# Patient Record
Sex: Female | Born: 1975
Health system: Southern US, Community
[De-identification: ages and names within clinical notes are randomized; demographics above are authoritative.]

## PROBLEM LIST (undated history)

## (undated) DIAGNOSIS — N739 Female pelvic inflammatory disease, unspecified: Secondary | ICD-10-CM

## (undated) DIAGNOSIS — A64 Unspecified sexually transmitted disease: Secondary | ICD-10-CM

## (undated) HISTORY — DX: Unspecified sexually transmitted disease: A64

## (undated) HISTORY — PX: APPENDECTOMY: SHX54

## (undated) HISTORY — PX: TUBAL LIGATION: SHX77

## (undated) HISTORY — DX: Female pelvic inflammatory disease, unspecified: N73.9

## (undated) HISTORY — PX: VULVA /PERINEUM BIOPSY: SHX319

---

## 2008-08-13 ENCOUNTER — Emergency Department (HOSPITAL_COMMUNITY): Admission: EM | Admit: 2008-08-13 | Discharge: 2008-08-13 | Payer: Self-pay | Admitting: Emergency Medicine

## 2010-07-14 LAB — POCT URINALYSIS DIP (DEVICE)
Bilirubin Urine: NEGATIVE
Glucose, UA: NEGATIVE mg/dL
Hgb urine dipstick: NEGATIVE
Nitrite: NEGATIVE
Specific Gravity, Urine: 1.015 (ref 1.005–1.030)
pH: 7 (ref 5.0–8.0)

## 2014-05-14 ENCOUNTER — Emergency Department (HOSPITAL_COMMUNITY): Payer: BLUE CROSS/BLUE SHIELD

## 2014-05-14 ENCOUNTER — Encounter (HOSPITAL_COMMUNITY): Payer: Self-pay | Admitting: Emergency Medicine

## 2014-05-14 ENCOUNTER — Emergency Department (HOSPITAL_COMMUNITY)
Admission: EM | Admit: 2014-05-14 | Discharge: 2014-05-15 | Disposition: A | Discharge status: Home / Self Care | Payer: BLUE CROSS/BLUE SHIELD | Attending: Emergency Medicine | Admitting: Emergency Medicine

## 2014-05-14 ENCOUNTER — Emergency Department (INDEPENDENT_AMBULATORY_CARE_PROVIDER_SITE_OTHER)
Admission: EM | Admit: 2014-05-14 | Discharge: 2014-05-14 | Disposition: A | Discharge status: Nursing Home (Includes ICF) | Payer: BLUE CROSS/BLUE SHIELD | Source: Home / Self Care | Attending: Emergency Medicine | Admitting: Emergency Medicine

## 2014-05-14 DIAGNOSIS — R0602 Shortness of breath: Secondary | ICD-10-CM | POA: Diagnosis present

## 2014-05-14 DIAGNOSIS — R002 Palpitations: Secondary | ICD-10-CM | POA: Insufficient documentation

## 2014-05-14 DIAGNOSIS — R9431 Abnormal electrocardiogram [ECG] [EKG]: Secondary | ICD-10-CM | POA: Diagnosis not present

## 2014-05-14 DIAGNOSIS — R0789 Other chest pain: Secondary | ICD-10-CM | POA: Diagnosis not present

## 2014-05-14 DIAGNOSIS — Z720 Tobacco use: Secondary | ICD-10-CM | POA: Insufficient documentation

## 2014-05-14 DIAGNOSIS — F419 Anxiety disorder, unspecified: Secondary | ICD-10-CM | POA: Diagnosis not present

## 2014-05-14 DIAGNOSIS — R079 Chest pain, unspecified: Secondary | ICD-10-CM

## 2014-05-14 LAB — CBC
HCT: 39.8 % (ref 36.0–46.0)
HEMOGLOBIN: 13.3 g/dL (ref 12.0–15.0)
MCH: 25 pg — ABNORMAL LOW (ref 26.0–34.0)
MCHC: 33.4 g/dL (ref 30.0–36.0)
MCV: 74.7 fL — ABNORMAL LOW (ref 78.0–100.0)
PLATELETS: 233 10*3/uL (ref 150–400)
RBC: 5.33 MIL/uL — ABNORMAL HIGH (ref 3.87–5.11)
RDW: 16.7 % — AB (ref 11.5–15.5)
WBC: 10.8 10*3/uL — ABNORMAL HIGH (ref 4.0–10.5)

## 2014-05-14 LAB — I-STAT TROPONIN, ED: TROPONIN I, POC: 0 ng/mL (ref 0.00–0.08)

## 2014-05-14 LAB — BASIC METABOLIC PANEL
Anion gap: 7 (ref 5–15)
BUN: 6 mg/dL (ref 6–23)
CO2: 24 mmol/L (ref 19–32)
Calcium: 9.5 mg/dL (ref 8.4–10.5)
Chloride: 105 mmol/L (ref 96–112)
Creatinine, Ser: 0.67 mg/dL (ref 0.50–1.10)
GFR calc Af Amer: >90 mL/min (ref >90)
GFR calc non Af Amer: >90 mL/min (ref >90)
Glucose, Bld: 90 mg/dL (ref 70–99)
Potassium: 3.7 mmol/L (ref 3.5–5.1)
SODIUM: 136 mmol/L (ref 135–145)

## 2014-05-14 LAB — BRAIN NATRIURETIC PEPTIDE: B NATRIURETIC PEPTIDE 5: 13.5 pg/mL (ref 0.0–100.0)

## 2014-05-14 MED ORDER — ASPIRIN 81 MG PO CHEW
324.0000 mg | CHEWABLE_TABLET | Freq: Once | ORAL | Status: AC
  Administered 2014-05-14: 324 mg via ORAL

## 2014-05-14 MED ORDER — ASPIRIN 81 MG PO CHEW
CHEWABLE_TABLET | ORAL | Status: AC
  Filled 2014-05-14: qty 4

## 2014-05-14 NOTE — ED Notes (Addendum)
C/o sob and chest tightness/ does not describe any pain or discomfort.  Patient has been stressed recently.  Associates tightness with diaphoresis and sob. Also has tightness in left arm

## 2014-05-14 NOTE — ED Provider Notes (Signed)
CSN: [id number]     Arrival date & time 05/14/14  1852 History   First MD Initiated Contact with Patient 05/14/14 1908     No chief complaint on file.  (Consider location/radiation/quality/duration/timing/severity/associated sxs/prior Treatment) HPI  She is a 39 year old woman here for evaluation of chest tightness. She states that all day she has been having intermittent palpitations. She works the night shift and was trying to sleep today, but the palpitations kept her awake. When she got up, she had tightness in her left upper chest and shoulder, diaphoresis, shortness of breath. She states she did received some bad news yesterday. She denies any medical history. She has not taken any medications. She is a current smoker. She does state she has some mild tightness in her left anterior shoulder.  No past medical history on file. No past surgical history on file. No family history on file. History  Substance Use Topics  . Smoking status: Not on file  . Smokeless tobacco: Not on file  . Alcohol Use: Not on file   OB History    No data available     Review of Systems  Constitutional: Positive for diaphoresis. Negative for fever and chills.  Respiratory: Positive for shortness of breath.   Cardiovascular: Positive for chest pain and palpitations.    Allergies  Review of patient's allergies indicates not on file.  Home Medications   Prior to Admission medications   Not on File   BP 158/84 mmHg  Pulse 96  Temp(Src) 97.8 F (36.6 C) (Axillary)  Resp 18  SpO2 99% Physical Exam  Constitutional: She is oriented to person, place, and time. She appears well-developed and well-nourished. No distress.  Neck: Neck supple.  Cardiovascular: Normal rate, regular rhythm and normal heart sounds.   No murmur heard. Pulmonary/Chest: Effort normal and breath sounds normal. No respiratory distress. She has no wheezes. She has no rales.  Neurological: She is alert and oriented to person,  place, and time.    ED Course  EKG  Date/Time: 05/14/2014 7:40 PM Performed by: Melony Overly Authorized by: Melony Overly Interpreted by ED physician Previous ECG: no previous ECG available Rhythm: sinus rhythm Rate: normal BPM: 79 Conduction: conduction normal ST Segments: ST segments normal T flattening: III, aVF, V5 and V6 Clinical impression: non-specific ECG Comments: Normal sinus rhythm with nonspecific T-wave changes.   (including critical care time) Labs Review Labs Reviewed - No data to display  Imaging Review No results found.   MDM   1. Chest pain, unspecified chest pain type    Aspirin 324 mg given. Will transfer to [facility] ED via shuttle for chest pain rule out. Heart score is 3, assuming a negative troponin. TIMI score is 0. Her vital signs are stable, EKG is nonspecific. I suspect this is likely anxiety, however cannot rule out cardiac.    Melony Overly, MD 05/14/14 [phone]

## 2014-05-14 NOTE — Discharge Instructions (Signed)
You have an abnormal EKG and the emergency department tonight. As we discussed, I would advise you to follow-up with primary care and with cardiology.   Palpitations A palpitation is the feeling that your heartbeat is irregular or is faster than normal. It may feel like your heart is fluttering or skipping a beat. Palpitations are usually not a serious problem. However, in some cases, you may need further medical evaluation. CAUSES  Palpitations can be caused by:  Smoking.  Caffeine or other stimulants, such as diet pills or energy drinks.  Alcohol.  Stress and anxiety.  Strenuous physical activity.  Fatigue.  Certain medicines.  Heart disease, especially if you have a history of irregular heart rhythms (arrhythmias), such as atrial fibrillation, atrial flutter, or supraventricular tachycardia.  An improperly working pacemaker or defibrillator. DIAGNOSIS  To find the cause of your palpitations, your health care provider will take your medical history and perform a physical exam. Your health care provider may also have you take a test called an ambulatory electrocardiogram (ECG). An ECG records your heartbeat patterns over a 24-hour period. You may also have other tests, such as:  Transthoracic echocardiogram (TTE). During echocardiography, sound waves are used to evaluate how blood flows through your heart.  Transesophageal echocardiogram (TEE).  Cardiac monitoring. This allows your health care provider to monitor your heart rate and rhythm in real time.  Holter monitor. This is a portable device that records your heartbeat and can help diagnose heart arrhythmias. It allows your health care provider to track your heart activity for several days, if needed.  Stress tests by exercise or by giving medicine that makes the heart beat faster. TREATMENT  Treatment of palpitations depends on the cause of your symptoms and can vary greatly. Most cases of palpitations do not require any  treatment other than time, relaxation, and monitoring your symptoms. Other causes, such as atrial fibrillation, atrial flutter, or supraventricular tachycardia, usually require further treatment. HOME CARE INSTRUCTIONS   Avoid:  Caffeinated coffee, tea, soft drinks, diet pills, and energy drinks.  Chocolate.  Alcohol.  Stop smoking if you smoke.  Reduce your stress and anxiety. Things that can help you relax include:  A method of controlling things in your body, such as your heartbeats, with your mind (biofeedback).  Yoga.  Meditation.  Physical activity such as swimming, jogging, or walking.  Get plenty of rest and sleep. SEEK MEDICAL CARE IF:   You continue to have a fast or irregular heartbeat beyond 24 hours.  Your palpitations occur more often. SEEK IMMEDIATE MEDICAL CARE IF:  You have chest pain or shortness of breath.  You have a severe headache.  You feel dizzy or you faint. MAKE SURE YOU:  Understand these instructions.  Will watch your condition.  Will get help right away if you are not doing well or get worse. Document Released: 03/19/2000 Document Revised: 03/27/2013 Document Reviewed: 05/21/2011 [facility] Patient Information 2015 [place], [place]. This information is not intended to replace advice given to you by your health care provider. Make sure you discuss any questions you have with your health care provider. Stress Stress-related medical problems are becoming increasingly common. The body has a built-in physical response to stressful situations. Faced with pressure, challenge or danger, we need to react quickly. Our bodies release hormones such as cortisol and adrenaline to help do this. These hormones are part of the "fight or flight" response and affect the metabolic rate, heart rate and blood pressure, resulting in a heightened, stressed  state that prepares the body for optimum performance in dealing with a stressful situation. It is likely that  early man required these mechanisms to stay alive, but usually modern stresses do not call for this, and the same hormones released in today's world can damage health and reduce coping ability. CAUSES  Pressure to perform at work, at school or in sports.  Threats of physical violence.  Money worries.  Arguments.  Family conflicts.  Divorce or separation from significant other.  Bereavement.  New job or unemployment.  Changes in location.  Alcohol or drug abuse. SOMETIMES, THERE IS NO PARTICULAR REASON FOR DEVELOPING STRESS. Almost all people are at risk of being stressed at some time in their lives. It is important to know that some stress is temporary and some is long term.  Temporary stress will go away when a situation is resolved. Most people can cope with short periods of stress, and it can often be relieved by relaxing, taking a walk or getting any type of exercise, chatting through issues with friends, or having a good night's sleep.  Chronic (long-term, continuous) stress is much harder to deal with. It can be psychologically and emotionally damaging. It can be harmful both for an individual and for friends and family. SYMPTOMS Everyone reacts to stress differently. There are some common effects that help [place] recognize it. In times of extreme stress, people may:  Shake uncontrollably.  Breathe faster and deeper than normal (hyperventilate).  Vomit.  For people with asthma, stress can trigger an attack.  For some people, stress may trigger migraine headaches, ulcers, and body pain. PHYSICAL EFFECTS OF STRESS MAY INCLUDE:  Loss of energy.  Skin problems.  Aches and pains resulting from tense muscles, including neck ache, backache and tension headaches.  Increased pain from arthritis and other conditions.  Irregular heart beat (palpitations).  Periods of irritability or anger.  Apathy or depression.  Anxiety (feeling uptight or worrying).  Unusual  behavior.  Loss of appetite.  Comfort eating.  Lack of concentration.  Loss of, or decreased, sex-drive.  Increased smoking, drinking, or recreational drug use.  For women, missed periods.  Ulcers, joint pain, and muscle pain. Post-traumatic stress is the stress caused by any serious accident, strong emotional damage, or extremely difficult or violent experience such as rape or war. Post-traumatic stress victims can experience mixtures of emotions such as fear, shame, depression, guilt or anger. It may include recurrent memories or images that may be haunting. These feelings can last for weeks, months or even years after the traumatic event that triggered them. Specialized treatment, possibly with medicines and psychological therapies, is available. If stress is causing physical symptoms, severe distress or making it difficult for you to function as normal, it is worth seeing your caregiver. It is important to remember that although stress is a usual part of life, extreme or prolonged stress can lead to other illnesses that will need treatment. It is better to visit a doctor sooner rather than later. Stress has been linked to the development of high blood pressure and heart disease, as well as insomnia and depression. There is no diagnostic test for stress since everyone reacts to it differently. But a caregiver will be able to spot the physical symptoms, such as:  Headaches.  Shingles.  Ulcers. Emotional distress such as intense worry, low mood or irritability should be detected when the doctor asks pertinent questions to identify any underlying problems that might be the cause. In case there are physical  reasons for the symptoms, the doctor may also want to do some tests to exclude certain conditions. If you feel that you are suffering from stress, try to identify the aspects of your life that are causing it. Sometimes you may not be able to change or avoid them, but even a small change  can have a positive ripple effect. A simple lifestyle change can make all the difference. STRATEGIES THAT CAN HELP DEAL WITH STRESS:  Delegating or sharing responsibilities.  Avoiding confrontations.  Learning to be more assertive.  Regular exercise.  Avoid using alcohol or street drugs to cope.  Eating a healthy, balanced diet, rich in fruit and vegetables and proteins.  Finding humor or absurdity in stressful situations.  Never taking on more than you know you can handle comfortably.  Organizing your time better to get as much done as possible.  Talking to friends or family and sharing your thoughts and fears.  Listening to music or relaxation tapes.  Relaxation techniques like deep breathing, meditation, and yoga.  Tensing and then relaxing your muscles, starting at the toes and working up to the head and neck. If you think that you would benefit from help, either in identifying the things that are causing your stress or in learning techniques to help you relax, see a caregiver who is capable of helping you with this. Rather than relying on medications, it is usually better to try and identify the things in your life that are causing stress and try to deal with them. There are many techniques of managing stress including counseling, psychotherapy, aromatherapy, yoga, and exercise. Your caregiver can help you determine what is best for you. Document Released: 06/12/2002 Document Revised: 03/27/2013 Document Reviewed: 05/09/2007 [facility] Patient Information 2015 [place], [place]. This information is not intended to replace advice given to you by your health care provider. Make sure you discuss any questions you have with your health care provider.

## 2014-05-14 NOTE — ED Notes (Signed)
Pt. reports " dull" pain at left shoulder radiating down to left upper arm with palpitations , SOB and diaphoresis onset this evening .

## 2014-05-14 NOTE — ED Notes (Signed)
The pt reports that she feels better now.  No pain or  discomfiort

## 2014-05-14 NOTE — ED Provider Notes (Signed)
CSN: [id number]     Arrival date & time 05/14/14  2006 History   First MD Initiated Contact with Patient 05/14/14 2253     Chief Complaint  Patient presents with  . Shoulder Pain  . Arm Pain  . Shortness of Breath  . Palpitations   Tamara Banks is a 39 y.o. female who is a smoker with no known medical problems who presents to the ED from urgent care complaining of chest tightness, left shoulder pain and increased stress today. Patient reports that around 5 PM tonight she felt flushed and had heart racing and a dull feeling in her left arm. She denies any pain in her chest. She reports this lasted for approximately 1 hour. She presents resolved after receiving 324 aspirin at urgent care. Patient reports having increased stress since last night and received some bad news today. Patient attributes her symptoms to stress today. At the time of my evaluation the patient reports feeling better and has none of her previous symptoms. The patient reports a grandfather with a history of an MI but denies personal history cardiovascular disease. Patient is a smoker. Patient denies personal or family history of DVTs or PEs. Patient denies personal or family history of blood clotting disorders such as factor V Leiden, protein C or S deficiency. Patient is not on any exogenous estrogens. Patient denies fevers, chills, recent illness, leg swelling, chest pain, wheezing, cough, rashes, leg pain, numbness, tingling, abdominal pain, nausea, vomiting or headache. She denies suicidal or homicidal ideations.  (Consider location/radiation/quality/duration/timing/severity/associated sxs/prior Treatment) HPI  History reviewed. No pertinent past medical history. Past Surgical History  Procedure Laterality Date  . Appendectomy     No family history on file. History  Substance Use Topics  . Smoking status: Current Every Day Smoker  . Smokeless tobacco: Not on file  . Alcohol Use: Yes   OB History    No data available      Review of Systems  Constitutional: Negative for fever and chills.  HENT: Negative for congestion, ear pain and sore throat.   Eyes: Negative for visual disturbance.  Respiratory: Positive for chest tightness and shortness of breath. Negative for cough and wheezing.   Cardiovascular: Positive for palpitations. Negative for chest pain and leg swelling.  Gastrointestinal: Negative for nausea, vomiting, abdominal pain and diarrhea.  Genitourinary: Negative for dysuria.  Musculoskeletal: Negative for back pain and neck pain.  Skin: Negative for rash.  Neurological: Negative for weakness, light-headedness, numbness and headaches.  Psychiatric/Behavioral: Negative for suicidal ideas. The patient is nervous/anxious.       Allergies  Review of patient's allergies indicates no known allergies.  Home Medications   Prior to Admission medications   Not on File   BP 121/75 mmHg  Pulse 72  Temp(Src) 98.4 F (36.9 C) (Oral)  Resp 18  Wt 163 lb 3 oz (74.021 kg)  SpO2 100%  LMP 05/02/2014 Physical Exam  Constitutional: She appears well-developed and well-nourished. No distress.  Pleasant well-appearing female. Nontoxic appearing.  HENT:  Head: Normocephalic and atraumatic.  Mouth/Throat: Oropharynx is clear and moist. No oropharyngeal exudate.  Eyes: Conjunctivae are normal. Pupils are equal, round, and reactive to light. Right eye exhibits no discharge. Left eye exhibits no discharge.  Neck: Neck supple. No JVD present. No tracheal deviation present.  Cardiovascular: Normal rate, regular rhythm, normal heart sounds and intact distal pulses.  Exam reveals no gallop and no friction rub.   No murmur heard. Bilateral radial pulses are intact. Bilateral posterior  tibialis and dorsalis pedis pulses are intact.  Pulmonary/Chest: Effort normal and breath sounds normal. No respiratory distress. She has no wheezes. She has no rales.  Abdominal: Soft. Bowel sounds are normal. She exhibits no  distension. There is no tenderness.  Musculoskeletal: She exhibits no edema or tenderness.  No lower extremity edema or tenderness.  Lymphadenopathy:    She has no cervical adenopathy.  Neurological: She is alert. Coordination normal.  Skin: Skin is warm and dry. No rash noted. She is not diaphoretic. No erythema. No pallor.  Psychiatric: She has a normal mood and affect. Her behavior is normal. Her mood appears not anxious. She does not exhibit a depressed mood. She expresses no homicidal and no suicidal ideation.  Nursing note and vitals reviewed.   ED Course  Procedures (including critical care time) Labs Review Labs Reviewed  CBC - Abnormal; Notable for the following:    WBC 10.8 (*)    RBC 5.33 (*)    MCV 74.7 (*)    MCH 25.0 (*)    RDW 16.7 (*)    All other components within normal limits  BASIC METABOLIC PANEL  BRAIN NATRIURETIC PEPTIDE  I-STAT TROPOININ, ED    Imaging Review Dg Chest 2 View  05/14/2014   CLINICAL DATA:  Chest pain for 2 days  EXAM: CHEST - 2 VIEW  COMPARISON:  None available  FINDINGS: Lungs are clear. Heart size and mediastinal contours are within normal limits. No effusion.  No pneumothorax. Visualized skeletal structures are unremarkable.  IMPRESSION: No acute cardiopulmonary disease.   Electronically Signed   By: Lucrezia Europe M.D.   On: 05/14/2014 20:49     EKG Interpretation   Date/Time:  Tuesday May 14 2014 20:11:14 EST Ventricular Rate:  84 PR Interval:  136 QRS Duration: 80 QT Interval:  364 QTC Calculation: 430 R Axis:   59 Text Interpretation:  Normal sinus rhythm with sinus arrhythmia T wave  abnormality, consider inferior ischemia Abnormal ECG Since last tracing T  wave abnormality is more pronounced Confirmed by Eulis Foster  MD, ELLIOTT  ([id number]) on 05/14/2014 11:37:09 PM      MDM   Final diagnoses:  Abnormal EKG  Intermittent palpitations    Patient presented with symptoms of chest tightness, palpitations and dull feeling in the  left arm. At time of evaluation the symptoms have resolved. Patient is to be discharged with recommendation to follow up with PCP in regards to today's hospital visit. Chest discomfort is not likely of cardiac or pulmonary etiology d/t presentation, perc negative, VSS, no tracheal deviation, no JVD or new murmur, RRR, breath sounds equal bilaterally, EKG without acute abnormalities, negative troponin, and negative CXR. Pt has been advised to return to the ED if CP becomes exertional, associated with diaphoresis or nausea, radiates to left jaw/arm, worsens or becomes concerning in any way. Pt appears reliable for follow up and is agreeable to discharge. Patient does have abnormal EKG with T-wave inversion. I advised patient she should follow-up with primary care and cardiology for stress test and echocardiogram. The patient verbalized understanding and agreement with plan.  Case has been discussed with and seen by Dr. Eulis Foster who agrees with the above plan to discharge.       Hanley Hays, PA-C 05/14/14 [place], MD 05/15/14 [phone]

## 2016-12-02 ENCOUNTER — Encounter (HOSPITAL_COMMUNITY): Payer: Self-pay | Admitting: Emergency Medicine

## 2016-12-02 DIAGNOSIS — Y929 Unspecified place or not applicable: Secondary | ICD-10-CM | POA: Insufficient documentation

## 2016-12-02 DIAGNOSIS — X58XXXA Exposure to other specified factors, initial encounter: Secondary | ICD-10-CM | POA: Insufficient documentation

## 2016-12-02 DIAGNOSIS — Y939 Activity, unspecified: Secondary | ICD-10-CM | POA: Insufficient documentation

## 2016-12-02 DIAGNOSIS — F17200 Nicotine dependence, unspecified, uncomplicated: Secondary | ICD-10-CM | POA: Insufficient documentation

## 2016-12-02 DIAGNOSIS — Y999 Unspecified external cause status: Secondary | ICD-10-CM | POA: Insufficient documentation

## 2016-12-02 DIAGNOSIS — S161XXA Strain of muscle, fascia and tendon at neck level, initial encounter: Secondary | ICD-10-CM | POA: Insufficient documentation

## 2016-12-02 NOTE — ED Triage Notes (Signed)
Pt states about an hour ago she started having pain in her left neck and shoulder area  Pt states the pain then goes across her shoulder area and down into her mid back  Pt states her blood pressure is elevated  Pt states the pain is intermittent and intense when it comes

## 2016-12-03 ENCOUNTER — Emergency Department (HOSPITAL_COMMUNITY)
Admission: EM | Admit: 2016-12-03 | Discharge: 2016-12-03 | Disposition: A | Discharge status: Home / Self Care | Payer: BLUE CROSS/BLUE SHIELD | Attending: Emergency Medicine | Admitting: Emergency Medicine

## 2016-12-03 DIAGNOSIS — S161XXA Strain of muscle, fascia and tendon at neck level, initial encounter: Secondary | ICD-10-CM

## 2016-12-03 LAB — BASIC METABOLIC PANEL
Anion gap: 9 (ref 5–15)
BUN: 14 mg/dL (ref 6–20)
CO2: 24 mmol/L (ref 22–32)
CREATININE: 0.58 mg/dL (ref 0.44–1.00)
Calcium: 9.5 mg/dL (ref 8.9–10.3)
Chloride: 108 mmol/L (ref 101–111)
GFR calc Af Amer: >60 mL/min (ref >60)
GLUCOSE: 97 mg/dL (ref 65–99)
POTASSIUM: 3.5 mmol/L (ref 3.5–5.1)
Sodium: 141 mmol/L (ref 135–145)

## 2016-12-03 LAB — CBC
HEMATOCRIT: 35.4 % — AB (ref 36.0–46.0)
Hemoglobin: 12.2 g/dL (ref 12.0–15.0)
MCH: 24.5 pg — ABNORMAL LOW (ref 26.0–34.0)
MCHC: 34.5 g/dL (ref 30.0–36.0)
MCV: 71.2 fL — ABNORMAL LOW (ref 78.0–100.0)
PLATELETS: 206 10*3/uL (ref 150–400)
RBC: 4.97 MIL/uL (ref 3.87–5.11)
RDW: 16.7 % — AB (ref 11.5–15.5)
WBC: 10 10*3/uL (ref 4.0–10.5)

## 2016-12-03 LAB — I-STAT TROPONIN, ED: Troponin i, poc: 0 ng/mL (ref 0.00–0.08)

## 2016-12-03 MED ORDER — IBUPROFEN 600 MG PO TABS: 600.0000 mg | ORAL_TABLET | Freq: Four times a day (QID) | ORAL | 0 refills | Status: AC | PRN

## 2016-12-03 MED ORDER — IBUPROFEN 800 MG PO TABS
800.0000 mg | ORAL_TABLET | Freq: Once | ORAL | Status: AC
  Administered 2016-12-03: 800 mg via ORAL
  Filled 2016-12-03: qty 1

## 2016-12-03 MED ORDER — CYCLOBENZAPRINE HCL 10 MG PO TABS: 10.0000 mg | ORAL_TABLET | Freq: Two times a day (BID) | ORAL | 0 refills | Status: DC | PRN

## 2016-12-03 NOTE — ED Provider Notes (Signed)
[facility] DEPT Provider Note   CSN: [id number] Arrival date & time: 12/02/16  2328     History   Chief Complaint Chief Complaint  Patient presents with  . Back Pain    HPI Tamara Banks is a 41 y.o. female.  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 She described pain as a sharp pain tightness sensation, initially intense. She did endorse some mild dizziness when the pain first brought on. She went to work, and discussed this with her coworker. She had a blood pressure check at that time but was told that it was high and patient came to ER for further evaluation. While waiting the ED, her symptoms improved. Currently her pain is 3 of 10. She did take aspirin and Tums prior to arrival which seems to help. She is a smoker and smoked half a pack a day, she is a social drinker. Does report family history of cardiac disease without any history of premature cardiac death. 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 No other strenuous activities. Currently patient denies any fever, chills, headache, nausea, vomiting, diarrhea, diaphoresis, arm pain, arm weakness, active chest pain or trouble breathing. No other complaint. No history of high blood pressure.   History reviewed. No pertinent past medical history.  There are no active problems to display for this patient.   Past Surgical History:  Procedure Laterality Date  . APPENDECTOMY    . TUBAL LIGATION      OB History    No data available       Home Medications    Prior to Admission medications   Medication Sig Start Date End Date Taking? Authorizing Provider  aspirin 325 MG tablet Take 325 mg by mouth every 6 (six) hours as needed for moderate pain.   Yes [provider]  calcium carbonate (TUMS -  DOSED IN MG ELEMENTAL CALCIUM) 500 MG chewable tablet Chew 3 tablets by mouth every 4 (four) hours as needed for indigestion or heartburn.   Yes [provider]    Family History Family History  Problem Relation Age of Onset  . Hypertension Father     Social History Social History  Substance Use Topics  . Smoking status: Current Every Day Smoker  . Smokeless tobacco: Never Used  . Alcohol use Yes     Allergies   Patient has no known allergies.   Review of Systems Review of Systems  All other systems reviewed and are negative.    Physical Exam Updated Vital Signs BP (!) 154/96 (BP Location: Right Arm)   Pulse 87   Temp 98.1 F (36.7 C) (Oral)   Resp 20   LMP 11/29/2016 (Exact Date)   SpO2 100%   Physical Exam  Constitutional: She is oriented to person, place, and time. She appears well-developed and well-nourished. No distress.  HENT:  Head: Normocephalic and atraumatic.  Eyes: Pupils are equal, round, and reactive to light. Conjunctivae and EOM are normal.  Neck: Normal range of motion. Neck supple.  No nuchal rigidity  Cardiovascular: Normal rate, regular rhythm and intact distal pulses.   Pulmonary/Chest: Effort normal and breath sounds normal.  Abdominal: Soft. Bowel sounds are normal.  Musculoskeletal: She exhibits tenderness (mild tenderness noted to left Cervical spinal muscle on palpation, left  trapezius, and posterior left scapularegion).  Neurological: She is alert and oriented to person, place, and time.  Skin: No rash noted.  Psychiatric: She has a normal mood and affect.  Nursing note and vitals reviewed.    ED Treatments / Results  Labs (all labs ordered are listed, but only abnormal results are displayed) Labs Reviewed  CBC - Abnormal; Notable for the following:       Result Value   HCT 35.4 (*)    MCV 71.2 (*)    MCH 24.5 (*)    RDW 16.7 (*)    All other components within normal limits  BASIC METABOLIC PANEL  I-STAT  TROPONIN, ED    EKG  EKG Interpretation  Date/Time:  Thursday December 02 2016 23:34:44 EDT Ventricular Rate:  86 PR Interval:    QRS Duration: 103 QT Interval:  347 QTC Calculation: 415 R Axis:   55 Text Interpretation:  Sinus rhythm Borderline T abnormalities, inferior leads No significant change since last tracing Confirmed by Glynn Octave [phone]) on 12/03/2016 2:16:33 AM       Radiology No results found.  Procedures Procedures (including critical care time)  Medications Ordered in ED Medications - No data to display   Initial Impression / Assessment and Plan / ED Course  I have reviewed the triage vital signs and the nursing notes.  Pertinent labs & imaging results that were available during my care of the patient were reviewed by me and considered in my medical decision making (see chart for details).     BP 122/86 (BP Location: Right Arm)   Pulse 77   Temp 98.1 F (36.7 C) (Oral)   Resp 16   LMP 11/29/2016 (Exact Date)   SpO2 100%    Final Clinical Impressions(s) / ED Diagnoses   Final diagnoses:  Acute strain of neck muscle, initial encounter    New Prescriptions New Prescriptions   CYCLOBENZAPRINE (FLEXERIL) 10 MG TABLET    Take 1 tablet (10 mg total) by mouth 2 (two) times daily as needed for muscle spasms.   IBUPROFEN (ADVIL,MOTRIN) 600 MG TABLET    Take 1 tablet (600 mg total) by mouth every 6 (six) hours as needed.   2:48 AM Patient here with left neck pain reproducible on exam.no carotid bruit noted. No signs of infectious etiology. Patient is neurovascularly intact. No active chest pain or short of breath.she is well-appearing, suspect pain is likely musculoskeletal.the EKG was without concerning features. Normal CBC, normal BMP. patient given reassurance, ibuprofen as needed for pain. Encouraged patient to follow-up outpatient for further evaluation of her condition. Her blood pressure is mildly elevated at 154/96, and this will need to be  rechecked by her PCP.  Return precaution discussed.  3:09 AM BP normalized on recheck.   Pt stasble for discharge.    Domenic Moras, PA-C 12/03/16 0310    Everlene Balls, MD 12/03/16 [phone]

## 2017-01-05 ENCOUNTER — Other Ambulatory Visit (HOSPITAL_COMMUNITY)
Admission: RE | Admit: 2017-01-05 | Discharge: 2017-01-05 | Disposition: A | Discharge status: Home / Self Care | Payer: BLUE CROSS/BLUE SHIELD | Source: Ambulatory Visit | Attending: Certified Nurse Midwife | Admitting: Certified Nurse Midwife

## 2017-01-05 ENCOUNTER — Ambulatory Visit (INDEPENDENT_AMBULATORY_CARE_PROVIDER_SITE_OTHER): Payer: BLUE CROSS/BLUE SHIELD | Admitting: Certified Nurse Midwife

## 2017-01-05 ENCOUNTER — Encounter: Payer: Self-pay | Admitting: Certified Nurse Midwife

## 2017-01-05 VITALS — BP 118/80 | HR 72 | Resp 16 | Ht 64.75 in | Wt 177.0 lb

## 2017-01-05 DIAGNOSIS — Z0000 Encounter for general adult medical examination without abnormal findings: Secondary | ICD-10-CM | POA: Diagnosis not present

## 2017-01-05 DIAGNOSIS — N6320 Unspecified lump in the left breast, unspecified quadrant: Secondary | ICD-10-CM

## 2017-01-05 DIAGNOSIS — N907 Vulvar cyst: Secondary | ICD-10-CM | POA: Diagnosis not present

## 2017-01-05 DIAGNOSIS — Z01419 Encounter for gynecological examination (general) (routine) without abnormal findings: Secondary | ICD-10-CM | POA: Diagnosis not present

## 2017-01-05 DIAGNOSIS — Z124 Encounter for screening for malignant neoplasm of cervix: Secondary | ICD-10-CM

## 2017-01-05 NOTE — Progress Notes (Signed)
Scheduled patient while in office for bilateral diagnostic mammogram [facility] 01/11[facility]2017 at 10:40 am. Patient is agreeable to date and time. Placed in mammogram hold. Scheduled to see Dr.Jertson for right vulva sebaceous cyst evaluation on 01/11/2017 at 9:30 am.

## 2017-01-05 NOTE — Patient Instructions (Signed)

## 2017-01-05 NOTE — Progress Notes (Addendum)
41 y.o. [id number] Married  African American Fe here to establish care and  for annual exam. Periods normal, 28 day cycle, duration 3-5 with 2 days heavy with some cramping since BTL in 2007. Uses Motrin if needed. Uses Urgent care if needed, but plans to establish with PCP. Smoker every day < than  1/2 pack a day. Would like to have screening labs today if possible. Has noted sore area around outer area of left breast off and on for the past few weeks. Denies nipple discharge or skin change. No trauma to area.. Recent shaving of underarm, but do no think this is the problem. No other health issues today.  Patient's last menstrual period was 12/23/2016 (exact date).          Sexually active: Yes.    The current method of family planning is tubal ligation.    Exercising: No.  exercise Smoker:  yes  Health Maintenance: Pap:  1/15 neg per patient due now History of Abnormal Pap: no MMG:  none Self Breast exams: yes Colonoscopy:  none BMD:   none TDaP:  2014 Shingles: no Pneumonia: no Hep C and HIV: not done Labs: yes   reports that she has been smoking.  She has been smoking about 0.50 packs per day. She has never used smokeless tobacco. She reports that she drinks about 3.6 oz of alcohol per week . She reports that she does not use drugs.  Past Medical History:  Diagnosis Date  . PID (pelvic inflammatory disease)   . STD (sexually transmitted disease)    chlamydia over 88yr ago    Past Surgical History:  Procedure Laterality Date  . APPENDECTOMY    . TUBAL LIGATION      Current Outpatient Prescriptions  Medication Sig Dispense Refill  . ibuprofen (ADVIL,MOTRIN) 600 MG tablet Take 1 tablet (600 mg total) by mouth every 6 (six) hours as needed. 30 tablet 0   No current facility-administered medications for this visit.     Family History  Problem Relation Age of Onset  . Hypertension Father   . Diabetes Maternal Aunt   . Lung cancer Paternal Grandmother   . Hypertension  Paternal Grandfather   . Heart disease Paternal Grandfather        bypass surgery    ROS:  Pertinent items are noted in HPI.  Otherwise, a comprehensive ROS was negative.  Exam:   BP 118/80   Pulse 72   Resp 16   Ht 5' 4.75" (1.645 m)   Wt 177 lb (80.3 kg)   LMP 12/23/2016 (Exact Date)   BMI 29.68 kg/m  Height: 5' 4.75" (164.5 cm) Ht Readings from Last 3 Encounters:  01/05/17 5' 4.75" (1.645 m)    General appearance: alert, cooperative and appears stated age Head: Normocephalic, without obvious abnormality, atraumatic Neck: no adenopathy, supple, symmetrical, trachea midline and thyroid normal to inspection and palpation Lungs: clear to auscultation bilaterally Breasts: normal appearance, no masses or tenderness, No nipple retraction or dimpling, No nipple discharge or bleeding, No axillary or supraclavicular adenopathy ? Mass vs lymph node noted on upper outer edge at axilla, non tender, no redness Heart: regular rate and rhythm Abdomen: soft, non-tender; no masses,  no organomegaly Extremities: extremities normal, atraumatic, no cyanosis or edema Skin: Skin color, texture, turgor normal. No rashes or lesions Lymph nodes: Cervical, supraclavicular, and axillary nodes normal. No abnormal inguinal nodes palpated Neurologic: Grossly normal   Pelvic: External genitalia:  Normal female with bean size appearing sebaceous  cyst on right labia, non tender, no exudate prominent( patient aware of presence), left labia no changes              Urethra:  normal appearing urethra with no masses, tenderness or lesions              Bartholin's and Skene's: normal                 Vagina: normal appearing vagina with normal color and discharge, no lesions              Cervix: multiparous appearance, no cervical motion tenderness and no lesions              Pap taken: Yes.   Bimanual Exam:  Uterus:  normal size, contour, position, consistency, mobility, non-tender              Adnexa: normal  adnexa and no mass, fullness, tenderness               Rectovaginal: Confirms               Anus:  normal sphincter tone, no lesions  Chaperone present: yes  A:  Well Woman with normal exam  Contraception BTL  Left breast mass vs axillary lymph node  Right vulva sebaceous cyst  History of Chlamydia with treatment >20 years ago  Screening labs/STD screening  P:   Reviewed health and wellness pertinent to exam  Discussed finding in breast and need for evaluation. Patient has not have mammogram screening yet, but will need diagnostic mammogram and [place] of area in left breast. Patient agreeable. Patient will be scheduled prior to leaving today. Questions addressed at length of what to expect with first mammogram and finding will be discussed prior to leaving her mammogram exam.  Aware of vulva sebaceous cyst. Discussed etiology with appearance only evaluation and can have evaluation for removal. Patient would like to have removed, due to uncomfortable with underwear and appearance. Patient will be scheduled for evaluation for removal with MD prior to leaving. Patient aware this is usually benign finding. Questions addressed.  Labs: HIV,RPR, Hep B,C, Vaginal screen, GC/Chlamydia, CMP, CBC  Pap smear: yes   counseled on breast self exam, mammography screening, STD prevention, HIV risk factors and prevention, adequate intake of calcium and vitamin D, diet and exercise  return annually or prn  An After Visit Summary was printed and given to the patient.

## 2017-01-06 ENCOUNTER — Telehealth: Payer: Self-pay | Admitting: *Deleted

## 2017-01-06 ENCOUNTER — Other Ambulatory Visit: Payer: Self-pay | Admitting: Certified Nurse Midwife

## 2017-01-06 DIAGNOSIS — E559 Vitamin D deficiency, unspecified: Secondary | ICD-10-CM

## 2017-01-06 DIAGNOSIS — R899 Unspecified abnormal finding in specimens from other organs, systems and tissues: Secondary | ICD-10-CM

## 2017-01-06 LAB — COMPREHENSIVE METABOLIC PANEL
ALBUMIN: 4.5 g/dL (ref 3.5–5.5)
ALK PHOS: 109 IU/L (ref 39–117)
ALT: 16 IU/L (ref 0–32)
AST: 18 IU/L (ref 0–40)
Albumin/Globulin Ratio: 1.4 (ref 1.2–2.2)
BUN / CREAT RATIO: 11 (ref 9–23)
BUN: 7 mg/dL (ref 6–24)
Bilirubin Total: 0.2 mg/dL (ref 0.0–1.2)
CO2: 23 mmol/L (ref 20–29)
CREATININE: 0.66 mg/dL (ref 0.57–1.00)
Calcium: 9.9 mg/dL (ref 8.7–10.2)
Chloride: 98 mmol/L (ref 96–106)
GFR, EST AFRICAN AMERICAN: 125 mL/min/{1.73_m2} (ref >59)
GFR, EST NON AFRICAN AMERICAN: 109 mL/min/{1.73_m2} (ref >59)
GLOBULIN, TOTAL: 3.2 g/dL (ref 1.5–4.5)
Glucose: 97 mg/dL (ref 65–99)
Potassium: 4.4 mmol/L (ref 3.5–5.2)
SODIUM: 137 mmol/L (ref 134–144)
TOTAL PROTEIN: 7.7 g/dL (ref 6.0–8.5)

## 2017-01-06 LAB — CBC
HEMATOCRIT: 37 % (ref 34.0–46.6)
Hemoglobin: 11.9 g/dL (ref 11.1–15.9)
MCH: 23.2 pg — AB (ref 26.6–33.0)
MCHC: 32.2 g/dL (ref 31.5–35.7)
MCV: 72 fL — AB (ref 79–97)
PLATELETS: 309 10*3/uL (ref 150–379)
RBC: 5.12 x10E6/uL (ref 3.77–5.28)
RDW: 16.9 % — AB (ref 12.3–15.4)
WBC: 12.6 10*3/uL — AB (ref 3.4–10.8)

## 2017-01-06 LAB — CYTOLOGY - PAP
DIAGNOSIS: NEGATIVE
HPV: NOT DETECTED

## 2017-01-06 LAB — HEP, RPR, HIV PANEL
HIV Screen 4th Generation wRfx: NONREACTIVE
Hepatitis B Surface Ag: NEGATIVE
RPR Ser Ql: NONREACTIVE

## 2017-01-06 LAB — VAGINITIS/VAGINOSIS, DNA PROBE
Candida Species: NEGATIVE
Gardnerella vaginalis: POSITIVE — AB
Trichomonas vaginosis: NEGATIVE

## 2017-01-06 LAB — TSH: TSH: 2 u[IU]/mL (ref 0.450–4.500)

## 2017-01-06 LAB — VITAMIN D 25 HYDROXY (VIT D DEFICIENCY, FRACTURES): Vit D, 25-Hydroxy: 6.9 ng/mL — ABNORMAL LOW (ref 30.0–100.0)

## 2017-01-06 LAB — HEPATITIS C ANTIBODY: Hep C Virus Ab: <0.1 s/co ratio (ref 0.0–0.9)

## 2017-01-06 MED ORDER — VITAMIN D (ERGOCALCIFEROL) 1.25 MG (50000 UNIT) PO CAPS: 50000.0000 [IU] | ORAL_CAPSULE | ORAL | 0 refills | Status: DC

## 2017-01-06 MED ORDER — METRONIDAZOLE 500 MG PO TABS: 500.0000 mg | ORAL_TABLET | Freq: Two times a day (BID) | ORAL | 0 refills | Status: DC

## 2017-01-06 NOTE — Telephone Encounter (Signed)
Notes recorded by Burnice Logan, RN on 01/06/2017 at 11:47 AM EDT Left message to call Sharee Pimple at [phone]. See telephone encounter dated 01/06/17.  ------  Notes recorded by Regina Eck, CNM on 01/06/2017 at 7:45 AM EDT Notify patient that vaginal screen was negative for yeast and trichomonas. Positive for BV. Will need Rx Flagyl 500 mg bid x 7 days. Avoid alcohol during treatment. Order not placed Vitamin D is very low which contributes to fatigue and sometimes depression. Needs to start on Rx 50,000 once weekly for 2 months and recheck order placed, please schedule HIV,RPR, Hep B, C negative TSH is normal Liver, Kidney and glucose panel is normal CBC is showing elevated WBC which can occur with low grade infection or sinus changes or immune status change recheck with appointment with Dr. Talbert Nan for cyst evaluation. Order placed.

## 2017-01-06 NOTE — Telephone Encounter (Signed)
Spoke with patient, advised as seen below per Melvia Heaps, CNM. Rx for Flagyl and Vit D to verified pharmacy on file. Scheduled for lab appt 03/14/17 at 8:45am for Vit D. ETOH precautions reviewed.   Patient verbalizes understanding and is agreeable.  Patient is agreeable to disposition. Will close encounter.

## 2017-01-07 LAB — GC/CHLAMYDIA PROBE AMP
CHLAMYDIA, DNA PROBE: NEGATIVE
NEISSERIA GONORRHOEAE BY PCR: NEGATIVE

## 2017-01-11 ENCOUNTER — Telehealth: Payer: Self-pay | Admitting: *Deleted

## 2017-01-11 ENCOUNTER — Ambulatory Visit
Admission: RE | Admit: 2017-01-11 | Discharge: 2017-01-11 | Disposition: A | Discharge status: Home / Self Care | Payer: BLUE CROSS/BLUE SHIELD | Source: Ambulatory Visit | Attending: Certified Nurse Midwife | Admitting: Certified Nurse Midwife

## 2017-01-11 ENCOUNTER — Encounter: Payer: Self-pay | Admitting: Obstetrics and Gynecology

## 2017-01-11 ENCOUNTER — Ambulatory Visit (INDEPENDENT_AMBULATORY_CARE_PROVIDER_SITE_OTHER): Payer: BLUE CROSS/BLUE SHIELD | Admitting: Obstetrics and Gynecology

## 2017-01-11 ENCOUNTER — Other Ambulatory Visit: Payer: Self-pay | Admitting: Certified Nurse Midwife

## 2017-01-11 VITALS — BP 110/64 | HR 88 | Resp 16 | Wt 179.0 lb

## 2017-01-11 DIAGNOSIS — N6320 Unspecified lump in the left breast, unspecified quadrant: Secondary | ICD-10-CM

## 2017-01-11 DIAGNOSIS — R899 Unspecified abnormal finding in specimens from other organs, systems and tissues: Secondary | ICD-10-CM | POA: Diagnosis not present

## 2017-01-11 DIAGNOSIS — N6310 Unspecified lump in the right breast, unspecified quadrant: Secondary | ICD-10-CM

## 2017-01-11 DIAGNOSIS — N907 Vulvar cyst: Secondary | ICD-10-CM | POA: Diagnosis not present

## 2017-01-11 DIAGNOSIS — N63 Unspecified lump in unspecified breast: Secondary | ICD-10-CM

## 2017-01-11 LAB — CBC WITH DIFFERENTIAL/PLATELET
BASOS ABS: 0 10*3/uL (ref 0.0–0.2)
Basos: 0 %
EOS (ABSOLUTE): 0.2 10*3/uL (ref 0.0–0.4)
EOS: 2 %
HEMATOCRIT: 37.1 % (ref 34.0–46.6)
HEMOGLOBIN: 11.4 g/dL (ref 11.1–15.9)
IMMATURE GRANS (ABS): 0 10*3/uL (ref 0.0–0.1)
Immature Granulocytes: 0 %
LYMPHS ABS: 3.4 10*3/uL — AB (ref 0.7–3.1)
LYMPHS: 36 %
MCH: 22.9 pg — ABNORMAL LOW (ref 26.6–33.0)
MCHC: 30.7 g/dL — ABNORMAL LOW (ref 31.5–35.7)
MCV: 75 fL — ABNORMAL LOW (ref 79–97)
MONOCYTES: 5 %
Monocytes Absolute: 0.5 10*3/uL (ref 0.1–0.9)
Neutrophils Absolute: 5.4 10*3/uL (ref 1.4–7.0)
Neutrophils: 57 %
Platelets: 249 10*3/uL (ref 150–379)
RBC: 4.98 x10E6/uL (ref 3.77–5.28)
RDW: 16.7 % — ABNORMAL HIGH (ref 12.3–15.4)
WBC: 9.5 10*3/uL (ref 3.4–10.8)

## 2017-01-11 NOTE — Patient Instructions (Signed)
Vulvar Biopsy Post-procedure Instructions . You may take Ibuprofen, Aleve, or Tylenol for any pain or discomfort. . You may have a small amount of spotting.  You should wear a mini pad for the next few days. . You may use some topical Neosporin ointment (over the counter) if you would like.   Marland Kitchen You will need to call the office if you have redness around the biopsy site, if there is any unusual drainage, if the bleeding is heavy, or if you have any concerns.   . Shower or bathe as normal . You will be notified within one week of your biopsy results or we will discuss your results at your follow-up appointment if needed.

## 2017-01-11 NOTE — Progress Notes (Signed)
GYNECOLOGY  VISIT   HPI: 41 y.o.   Married  [place] American  female   [id number] with Patient's last menstrual period was 12/23/2016 (exact date).   here for right vulva sebaceous cyst removal. The patient reports a 10 year h/o a bothersome right vulvar lesion, it is large (not growing), not tender. She desires removal.   GYNECOLOGIC HISTORY: Patient's last menstrual period was 12/23/2016 (exact date). Contraception:Tubal Ligation  Menopausal hormone therapy: none         OB History    Gravida Para Term Preterm AB Living   8       3 5    SAB TAB Ectopic Multiple Live Births   1 2               There are no active problems to display for this patient.   Past Medical History:  Diagnosis Date  . PID (pelvic inflammatory disease)   . STD (sexually transmitted disease)    chlamydia over 64yr ago    Past Surgical History:  Procedure Laterality Date  . APPENDECTOMY    . TUBAL LIGATION      Current Outpatient Prescriptions  Medication Sig Dispense Refill  . ibuprofen (ADVIL,MOTRIN) 600 MG tablet Take 1 tablet (600 mg total) by mouth every 6 (six) hours as needed. 30 tablet 0  . metroNIDAZOLE (FLAGYL) 500 MG tablet Take 1 tablet (500 mg total) by mouth 2 (two) times daily. 14 tablet 0  . Vitamin D, Ergocalciferol, (DRISDOL) 50000 units CAPS capsule Take 1 capsule (50,000 Units total) by mouth every 7 (seven) days. 8 capsule 0   No current facility-administered medications for this visit.      ALLERGIES: Patient has no known allergies.  Family History  Problem Relation Age of Onset  . Hypertension Father   . Diabetes Maternal Aunt   . Lung cancer Paternal Grandmother   . Hypertension Paternal Grandfather   . Heart disease Paternal Grandfather        bypass surgery    Social History   Social History  . Marital status: Married    Spouse name: N/A  . Number of children: N/A  . Years of education: N/A   Occupational History  . Not on file.   Social History Main  Topics  . Smoking status: Current Every Day Smoker    Packs/day: 0.50  . Smokeless tobacco: Never Used  . Alcohol use 3.6 oz/week    6 Standard drinks or equivalent per week  . Drug use: No  . Sexual activity: Yes    Partners: Male    Birth control/ protection: Surgical     Comment: BTL   Other Topics Concern  . Not on file   Social History Narrative  . No narrative on file    Review of Systems  Constitutional: Negative.   HENT: Negative.   Eyes: Negative.   Respiratory: Negative.   Cardiovascular: Negative.   Gastrointestinal: Negative.   Genitourinary:        Right vulva sebaceous cyst  Musculoskeletal: Negative.   Skin: Negative.   Neurological: Negative.   Endo/Heme/Allergies: Negative.   Psychiatric/Behavioral: Negative.     PHYSICAL EXAMINATION:    BP 110/64 (BP Location: Right Arm, Patient Position: Sitting, Cuff Size: Normal)   Pulse 88   Resp 16   Wt 179 lb (81.2 kg)   LMP 12/23/2016 (Exact Date)   BMI 30.02 kg/m     General appearance: alert, cooperative and appears stated age  Pelvic: External genitalia:  large vulvar cyst on the right labia majora.               Urethra:  normal appearing urethra with no masses, tenderness or lesions              Bartholins and Skenes: normal                  The risks of the procedure were reviewed with the patient and a consent was signed. The area was cleansed with betadine and injected with 1% lidocaine. A #11 blade was used to remove the vulvar cyst and surrounding skin. The defect was closed with 3 deep stiches of 4-0 vicryl and 4-5 superficial stiches. The patient tolerated the procedure well.   Chaperone was present for exam.  ASSESSMENT Large right vulvar cyst    PLAN Removal of large right vulvar cyst Discussed care Routine f/u   An After Visit Summary was printed and given to the patient.

## 2017-01-11 NOTE — Telephone Encounter (Signed)
Lattie Haw calling from [facility], request order for right breast ultrasound for right breast mass, verbal order given. Patient had bilateral diagnostic MMG, request further evaluation with right breast US. Order placed in EPIC by Lattie Haw, request to be co-signed by provider. Advised Lattie Haw would notify provider.  Melvia Heaps, CNM -can you sign order for right breast US?

## 2017-01-12 NOTE — Telephone Encounter (Signed)
signed

## 2017-01-13 NOTE — Telephone Encounter (Signed)
Will close encounter

## 2017-01-17 ENCOUNTER — Telehealth: Payer: Self-pay | Admitting: *Deleted

## 2017-01-17 NOTE — Telephone Encounter (Signed)
Notes recorded by Burnice Logan, RN on 01/17/2017 at 4:38 PM EDT Left message to call Sharee Pimple at [phone]. See telephone encounter dated 01/17/17.  ------  Notes recorded by Salvadore Dom, MD on 01/14/2017 at 8:10 AM EDT Please let the patient know that her biopsy returned as a benign cyst and please check and see how she is feeling (it was a large vulvar biopsy)

## 2017-01-20 NOTE — Telephone Encounter (Signed)
Patient would like a note excusing her from work on Wednesday 01/12/17 because she was not able to go to work after having biopsy done on Tuesday. Patient work nights so ok to leave a detailed message if she does not pick up.

## 2017-01-20 NOTE — Telephone Encounter (Signed)
Left message to call Sharee Pimple at [phone].   Letter to Dr. Talbert Nan for signature.

## 2017-01-26 NOTE — Telephone Encounter (Signed)
Spoke with patient, advised of results as seen below per Dr. Talbert Nan. Patient states she is doing well now, experienced discomfort after biopsy. Request letter for work to be mailed to home address on file, verified. Patient verbalizes understanding and is agreeable.   Letter placed in outgoing mail.  Routing to provider for final review. Patient is agreeable to disposition. Will close encounter.

## 2017-02-18 ENCOUNTER — Telehealth: Payer: Self-pay | Admitting: Certified Nurse Midwife

## 2017-02-18 ENCOUNTER — Ambulatory Visit: Payer: Self-pay | Admitting: Certified Nurse Midwife

## 2017-02-18 ENCOUNTER — Encounter: Payer: Self-pay | Admitting: Certified Nurse Midwife

## 2017-02-18 NOTE — Telephone Encounter (Signed)
Patient cancelled breast recheck today because she forgot and is working. Will call back to reschedule.

## 2017-02-22 NOTE — Telephone Encounter (Signed)
Patient needs follow up breast exam, due to no identifiable cause for area noted on [place] or Mammogram exam. History of fibroadenomas and cysts.

## 2017-02-22 NOTE — Telephone Encounter (Signed)
Spoke with patient. Patient request to lab appt for Vit D and breast recheck in same visit. OV scheduled for 03/09/17 at 10am with Melvia Heaps, CNM. Lab appt cancelled for 12/10. Patient verbalizes understanding and is agreeable.   Routing to provider for final review. Patient is agreeable to disposition. Will close encounter.

## 2017-02-22 NOTE — Telephone Encounter (Signed)
Left message to call Sharee Pimple at [phone].

## 2017-03-09 ENCOUNTER — Other Ambulatory Visit: Payer: Self-pay

## 2017-03-09 ENCOUNTER — Encounter: Payer: Self-pay | Admitting: Certified Nurse Midwife

## 2017-03-09 ENCOUNTER — Ambulatory Visit (INDEPENDENT_AMBULATORY_CARE_PROVIDER_SITE_OTHER): Payer: BLUE CROSS/BLUE SHIELD | Admitting: Certified Nurse Midwife

## 2017-03-09 VITALS — BP 112/62 | HR 64 | Resp 16 | Ht 64.75 in | Wt 180.0 lb

## 2017-03-09 DIAGNOSIS — Z1231 Encounter for screening mammogram for malignant neoplasm of breast: Secondary | ICD-10-CM

## 2017-03-09 DIAGNOSIS — E559 Vitamin D deficiency, unspecified: Secondary | ICD-10-CM | POA: Diagnosis not present

## 2017-03-09 DIAGNOSIS — Z1239 Encounter for other screening for malignant neoplasm of breast: Secondary | ICD-10-CM

## 2017-03-09 NOTE — Progress Notes (Signed)
   Subjective:   41 y.o. MarriedAfrican American female presents for follow up evaluation of bilateral breast masses and axillary area on left breast. Patient is premenstrual with slight tenderness. She has not noted any changes with breast and has done her SBE. Denies any nipple discharge or skin change bilateral. Also here for recheck of Vitamin D which was low, has been taking OTC supplement. Review of Systems Pertinent items are noted in HPI.   Objective:   General appearance: alert, cooperative, appears stated age and no distress Breasts: normal appearance, no masses or tenderness, No nipple retraction or dimpling, No nipple discharge or bleeding, No axillary or supraclavicular adenopathy, area of concern in left axillary area is no longer present, palpated the cystic area noted in left breast and fibroadenomas in right breast and correspond with mammogram and [place].  Assessment:   ASSESSMENT:Patient is diagnosed with right breast fibroadenomas and left breast cysts. Area in left axilla has resolved. Probable reactive lymph node. Vitamin D deficiency Plan:   PLAN: The patient has a documented plan to follow with further care of noted findings with follow up mammogram and [place] bilaterally in 6 months. She will continue SBE and come in if additional findings or change. Questions addressed. Will call patient with Vitamin D lab results.  RV prn, as above

## 2017-03-09 NOTE — Patient Instructions (Signed)
Breast Self-Awareness Breast self-awareness means:  Knowing how your breasts look.  Knowing how your breasts feel.  Checking your breasts every month for changes.  Telling your doctor if you notice a change in your breasts.  Breast self-awareness allows you to notice a breast problem early while it is still small. How to do a breast self-exam One way to learn what is normal for your breasts and to check for changes is to do a breast self-exam. To do a breast self-exam: Look for Changes  1. Take off all the clothes above your waist. 2. Stand in front of a mirror in a room with good lighting. 3. Put your hands on your hips. 4. Push your hands down. 5. Look at your breasts and nipples in the mirror to see if one breast or nipple looks different than the other. Check to see if: ? The shape of one breast is different. ? The size of one breast is different. ? There are wrinkles, dips, and bumps in one breast and not the other. 6. Look at each breast for changes in your skin, such as: ? Redness. ? Scaly areas. 7. Look for changes in your nipples, such as: ? Liquid around the nipples. ? Bleeding. ? Dimpling. ? Redness. ? A change in where the nipples are. Feel for Changes 1. Lie on your back on the floor. 2. Feel each breast. To do this, follow these steps: ? Pick a breast to feel. ? Put the arm closest to that breast above your head. ? Use your other arm to feel the nipple area of your breast. Feel the area with the pads of your three middle fingers by making small circles with your fingers. For the first circle, press lightly. For the second circle, press harder. For the third circle, press even harder. ? Keep making circles with your fingers at the light, harder, and even harder pressures as you move down your breast. Stop when you feel your ribs. ? Move your fingers a little toward the center of your body. ? Start making circles with your fingers again, this time going up until  you reach your collarbone. ? Keep making up and down circles until you reach your armpit. Remember to keep using the three pressures. ? Feel the other breast in the same way. 3. Sit or stand in the shower or tub. 4. With soapy water on your skin, feel each breast the same way you did in step 2, when you were lying on the floor. Write Down What You Find  After doing the self-exam, write down:  What is normal for each breast.  Any changes you find in each breast.  When you last had your period.  How often should I check my breasts? Check your breasts every month. If you are breastfeeding, the best time to check them is after you feed your baby or after you use a breast pump. If you get periods, the best time to check your breasts is 5-7 days after your period is over. When should I see my doctor? See your doctor if you notice:  A change in shape or size of your breasts or nipples.  A change in the skin of your breast or nipples, such as red or scaly skin.  Unusual fluid coming from your nipples.  A lump or thick area that was not there before.  Pain in your breasts.  Anything that concerns you.  This information is not intended to replace advice given to  you by your health care provider. Make sure you discuss any questions you have with your health care provider. Document Released: 09/08/2007 Document Revised: 08/28/2015 Document Reviewed: 02/09/2015 Elsevier Interactive Patient Education  [employer].

## 2017-03-10 LAB — VITAMIN D 25 HYDROXY (VIT D DEFICIENCY, FRACTURES): VIT D 25 HYDROXY: 30.7 ng/mL (ref 30.0–100.0)

## 2017-03-14 ENCOUNTER — Other Ambulatory Visit: Payer: Self-pay

## 2017-07-04 ENCOUNTER — Ambulatory Visit (INDEPENDENT_AMBULATORY_CARE_PROVIDER_SITE_OTHER): Payer: BLUE CROSS/BLUE SHIELD

## 2017-07-04 ENCOUNTER — Other Ambulatory Visit: Payer: Self-pay

## 2017-07-04 ENCOUNTER — Encounter: Payer: Self-pay | Admitting: Physician Assistant

## 2017-07-04 ENCOUNTER — Ambulatory Visit (INDEPENDENT_AMBULATORY_CARE_PROVIDER_SITE_OTHER): Payer: BLUE CROSS/BLUE SHIELD | Admitting: Physician Assistant

## 2017-07-04 VITALS — BP 126/74 | HR 111 | Temp 98.5°F | Resp 18 | Ht 66.54 in | Wt 179.4 lb

## 2017-07-04 DIAGNOSIS — R05 Cough: Secondary | ICD-10-CM

## 2017-07-04 DIAGNOSIS — F4323 Adjustment disorder with mixed anxiety and depressed mood: Secondary | ICD-10-CM

## 2017-07-04 DIAGNOSIS — Z720 Tobacco use: Secondary | ICD-10-CM

## 2017-07-04 DIAGNOSIS — R058 Other specified cough: Secondary | ICD-10-CM

## 2017-07-04 DIAGNOSIS — R0982 Postnasal drip: Secondary | ICD-10-CM

## 2017-07-04 DIAGNOSIS — R059 Cough, unspecified: Secondary | ICD-10-CM

## 2017-07-04 MED ORDER — MUCINEX DM MAXIMUM STRENGTH 60-1200 MG PO TB12: 1.0000 | ORAL_TABLET | Freq: Two times a day (BID) | ORAL | 0 refills | Status: DC

## 2017-07-04 MED ORDER — FLUTICASONE PROPIONATE 50 MCG/ACT NA SUSP: 2.0000 | Freq: Every day | NASAL | 0 refills | Status: AC

## 2017-07-04 MED ORDER — BENZONATATE 100 MG PO CAPS: 100.0000 mg | ORAL_CAPSULE | Freq: Three times a day (TID) | ORAL | 0 refills | Status: DC | PRN

## 2017-07-04 MED ORDER — CETIRIZINE HCL 10 MG PO TABS: 10.0000 mg | ORAL_TABLET | Freq: Every day | ORAL | 0 refills | Status: AC

## 2017-07-04 NOTE — Patient Instructions (Addendum)
Your chest x-ray was normal.  I am wondering if this is related to allergies.  I recommend treating it symptomatically at this time with Tessalon Perles, Mucinex DM, Zyrtec, and Flonase.  Use the Tessalon Perles if you want to stop the cough and Mucinex if you want to help bring it up.  If no improvement in 1-2 weeks, please return to office for further evaluation.  Also recommend smoking cessation.  I have given you some information below about this.  I have also recommended counseling for your current situation.  Below is some information for their basis.   For therapy -- [facility] ([place], [place], [place], [place]) - [phone] [facility] [place]) - [phone] [facility] - [phone] [facility] [place] - [phone] [facility]  - [phone] (do not file insurance)   Free counseling for Christian women in [place]: [place] ( 2 doors up from [facility] by [facility]) Mailing Address [address] [place], [place]. [address] [phone]  Smoking Tobacco Information Smoking tobacco will very likely harm your health. Tobacco contains a poisonous (toxic), colorless chemical called nicotine. Nicotine affects the brain and makes tobacco addictive. This change in your brain can make it hard to stop smoking. Tobacco also has other toxic chemicals that can hurt your body and raise your risk of many cancers. How can smoking tobacco affect me? Smoking tobacco can increase your chances of having serious health conditions, such as:  Cancer. Smoking is most commonly associated with lung cancer, but can lead to cancer in other parts of the body.  Chronic obstructive pulmonary disease (COPD). This is a long-term lung condition that makes it hard to  breathe. It also gets worse over time.  High blood pressure (hypertension), heart disease, stroke, or heart attack.  Lung infections, such as pneumonia.  Cataracts. This is when the lenses in the eyes become clouded.  Digestive problems. This may include peptic ulcers, heartburn, and gastroesophageal reflux disease (GERD).  Oral health problems, such as gum disease and tooth loss.  Loss of taste and smell.  Smoking can affect your appearance by causing:  Wrinkles.  Yellow or stained teeth, fingers, and fingernails.  Smoking tobacco can also affect your social life.  Many workplaces, [employer], hotels, and public places are tobacco-free. This means that you may experience challenges in finding places to smoke when away from home.  The cost of a smoking habit can be expensive. Expenses for someone who smokes come in two ways: ? You spend money on a regular basis to buy tobacco. ? Your health care costs in the long-term are higher if you smoke.  Tobacco smoke can also affect the health of those around you. Children of smokers have greater chances of: ? Sudden infant death syndrome (SIDS). ? Ear infections. ? Lung infections.  What lifestyle changes can be made?  Do not start smoking. Quit if you already do.  To quit smoking: ? Make a plan to quit smoking and commit yourself to it. Look for programs to help you and ask your health care provider for recommendations and ideas. ? Talk with your health care provider about using nicotine replacement medicines to help you quit. Medicine replacement medicines include gum, lozenges, patches, sprays, or pills. ? Do not replace cigarette smoking with electronic cigarettes, which are commonly called e-cigarettes. The safety of e-cigarettes is  not known, and some may contain harmful chemicals. ? Avoid places, people, or situations that tempt you to smoke. ? If you try to quit but return to smoking, don't give up hope. It is very common  for people to try a number of times before they fully succeed. When you feel ready again, give it another try.  Quitting smoking might affect the way you eat as well as your weight. Be prepared to monitor your eating habits. Get support in planning and following a healthy diet.  Ask your health care provider about having regular tests (screenings) to check for cancer. This may include blood tests, imaging tests, and other tests.  Exercise regularly. Consider taking walks, joining a gym, or doing yoga or exercise classes.  Develop skills to manage your stress. These skills include meditation. What are the benefits of quitting smoking? By quitting smoking, you may:  Lower your risk of getting cancer and other diseases caused by smoking.  Live longer.  Breathe better.  Lower your blood pressure and heart rate.  Stop your addiction to tobacco.  Stop creating secondhand smoke that hurts other people.  Improve your sense of taste and smell.  Look better over time, due to having fewer wrinkles and less staining.  What can happen if changes are not made? If you do not stop smoking, you may:  Get cancer and other diseases.  Develop COPD or other long-term (chronic) lung conditions.  Develop serious problems with your heart and blood vessels (cardiovascular system).  Need more tests to screen for problems caused by smoking.  Have higher, long-term healthcare costs from medicines or treatments related to smoking.  Continue to have worsening changes in your lungs, mouth, and nose.  Where to find support: To get support to quit smoking, consider:  Asking your health care provider for more information and resources.  Taking classes to learn more about quitting smoking.  Looking for local organizations that offer resources about quitting smoking.  Joining a support group for people who want to quit smoking in your local community.  Where to find more information: You may find  more information about quitting smoking from:  HelpGuide.org: www.helpguide.org/articles/addictions/how-to-quit-smoking.htm  https://hall.com/: smokefree.gov  American Lung Association: www.lung.org  Contact a health care provider if:  You have problems breathing.  Your lips, nose, or fingers turn blue.  You have chest pain.  You are coughing up blood.  You feel faint or you pass out.  You have other noticeable changes that cause you to worry. Summary  Smoking tobacco can negatively affect your health, the health of those around you, your finances, and your social life.  Do not start smoking. Quit if you already do. If you need help quitting, ask your health care provider.  Think about joining a support group for people who want to quit smoking in your local community. There are many effective programs that will help you to quit this behavior. This information is not intended to replace advice given to you by your health care provider. Make sure you discuss any questions you have with your health care provider. Document Released: 04/06/2016 Document Revised: 04/06/2016 Document Reviewed: 04/06/2016 Elsevier Interactive Patient Education  2018 [employer].    IF you received an x-ray today, you will receive an invoice from [facility]. Please contact [facility] at [phone] with questions or concerns regarding your invoice.   IF you received labwork today, you will receive an invoice from [place]. Please contact [facility] at [phone] with questions  or concerns regarding your invoice.   Our billing staff will not be able to assist you with questions regarding bills from these companies.  You will be contacted with the lab results as soon as they are available. The fastest way to get your results is to activate your My Chart account. Instructions are located on the last page of this paperwork. If you have not heard from [place] regarding the results in 2 weeks,  please contact this office.

## 2017-07-04 NOTE — Progress Notes (Signed)
MRN: [id number] DOB: April 08, 1975  Subjective:   Tamara Banks is a 42 y.o. female presenting for chief complaint of Depression (screening done) and Cough (X 3 weeks) .  Reports 3 week history of on and off productive cough and postnasal drainage. Thinks it is the drainage that is causing her to cough. Had flu 3 weeks ago and the cough has lingered. Has tried mucinex which does provide relief. Has also had some sneezing and itchy watery eyes since the pollen got worse outside. Denies fever, sinus pain, ear pain, sore throat, wheezing, shortness of breath and chest pain, chills, nausea, vomiting, abdominal pain and diarrhea. Has had sick contact with family members. No history of seasonal allergies, no history of asthma. Patient has had flu shot this season. Current smoker, smoked 0.5 ppd x 20 years.    Pt also been stressed this past month due to going through a divorce. She denies suicidal or homicidal ideation. She is interested in therapy.   Lior has a current medication list which includes the following prescription(s): cyclobenzaprine and ibuprofen. Also has No Known Allergies.  Norma  has a past medical history of PID (pelvic inflammatory disease) and STD (sexually transmitted disease). Also  has a past surgical history that includes Appendectomy; Tubal ligation; and Vulva / perineum biopsy.   Objective:   Vitals: BP 126/74 (BP Location: Left Arm, Patient Position: Sitting, Cuff Size: Normal)   Pulse (!) 111   Temp 98.5 F (36.9 C) (Oral)   Resp 18   Ht 5' 6.54" (1.69 m)   Wt 179 lb 6.4 oz (81.4 kg)   LMP 06/28/2017 (Approximate)   SpO2 97%   BMI 28.49 kg/m   Physical Exam  Constitutional: She is oriented to person, place, and time. She appears well-developed and well-nourished.  HENT:  Head: Normocephalic and atraumatic.  Right Ear: Tympanic membrane, external ear and ear canal normal.  Left Ear: Tympanic membrane, external ear and ear canal normal.  Nose: Mucosal  edema (moderate bilaterally) present. Right sinus exhibits no maxillary sinus tenderness and no frontal sinus tenderness. Left sinus exhibits no maxillary sinus tenderness and no frontal sinus tenderness.  Mouth/Throat: Uvula is midline and mucous membranes are normal. Posterior oropharyngeal erythema present. No posterior oropharyngeal edema. No tonsillar exudate.  Eyes: Conjunctivae are normal.  Neck: Normal range of motion.  Cardiovascular: Normal rate, regular rhythm and normal heart sounds.  Pulmonary/Chest: Effort normal and breath sounds normal. She has no decreased breath sounds. She has no wheezes. She has no rhonchi. She has no rales.  Lymphadenopathy:       Head (right side): No submental, no submandibular, no tonsillar, no preauricular, no posterior auricular and no occipital adenopathy present.       Head (left side): No submental, no submandibular, no tonsillar, no preauricular, no posterior auricular and no occipital adenopathy present.    She has no cervical adenopathy.       Right: No supraclavicular adenopathy present.       Left: No supraclavicular adenopathy present.  Neurological: She is alert and oriented to person, place, and time.  Skin: Skin is warm and dry.  Psychiatric: She has a normal mood and affect.  Vitals reviewed.   No results found for this or any previous visit (from the past 24 hour(s)).    Depression screen PHQ 2/9 07/04/2017  Decreased Interest 1  Down, Depressed, Hopeless 1  PHQ - 2 Score 2  Altered sleeping 3  Tired, decreased energy 2  Change in appetite 0  Feeling bad or failure about yourself  1  Trouble concentrating 0  Moving slowly or fidgety/restless 0  Suicidal thoughts 0  PHQ-9 Score 8  Difficult doing work/chores Somewhat difficult   GAD 7 : Generalized Anxiety Score 07/04/2017  Nervous, Anxious, on Edge 1  Control/stop worrying 0  Worry too much - different things 0  Trouble relaxing 1  Restless 1  Easily annoyed or irritable 0    Afraid - awful might happen 0  Total GAD 7 Score 3  Anxiety Difficulty Not difficult at all     Dg Chest 2 View  Result Date: 07/04/2017 CLINICAL DATA:  Cough over the last 3 weeks.  Smoking history. EXAM: CHEST - 2 VIEW COMPARISON:  05/14/2014 FINDINGS: Heart size is normal. Mediastinal shadows are normal. The lungs are clear. The vascularity is normal. No effusions. No bone abnormalities other than mild spinal curvature. IMPRESSION: No active cardiopulmonary disease.  Mild spinal curvature. Electronically Signed   By: Nelson Chimes M.D.   On: 07/04/2017 14:33    Assessment and Plan :  1. Upper airway cough syndrome 2. Cough - cetirizine (ZYRTEC) 10 MG tablet; Take 1 tablet (10 mg total) by mouth daily.  Dispense: 90 tablet; Refill: 0 - fluticasone (FLONASE) 50 MCG/ACT nasal spray; Place 2 sprays into both nostrils daily.  Dispense: 16 g; Refill: 0 Lungs CTAB.  Vitals stable.  Patient is overall well-appearing, no distress.  Normal CXR. Questioning if this cough is related to untreated allergies as it appears to be related to weather changes.  Will treat accordingly.  If no improvement in 1-2 weeks, patient encouraged to return office for further evaluation and management. - DG Chest 2 View; Future - benzonatate (TESSALON) 100 MG capsule; Take 1-2 capsules (100-200 mg total) by mouth 3 (three) times daily as needed for cough.  Dispense: 40 capsule; Refill: 0 - Dextromethorphan-guaiFENesin (MUCINEX DM MAXIMUM STRENGTH) 60-1200 MG TB12; Take 1 tablet by mouth every 12 (twelve) hours.  Dispense: 20 each; Refill: 0  3. Post-nasal drainage  4. Tobacco user Dsicussed smoking cessation.  Patient is motivated to quit.  Given educational material on smoking cessation.  She is going to try over-the-counter nicotine patches.  If no improvement with this, will return to office for further evaluation and management.  Recommended CPE soon. - DG Chest 2 View; Future  5. Adjustment disorder with mixed  anxiety and depressed mood Patient is currently going through a divorce, which is causing her anxiety and depression.  Denies suicidal or homicidal thoughts/ideations.  She is interested in counseling.  Provided patient with list of local therapist to reach out to.  If no improvement with this, please return office for further evaluation and management.  A total of 45 was spent in the room with the patient, greater than 50% of which was in counseling/coordination of care regarding upper airway cough syndrome, tobacco, and adjustment disorder.  Tenna Delaine, PA-C  Primary Care at [facility] 07/04/2017 2:42 PM

## 2017-07-18 ENCOUNTER — Other Ambulatory Visit: Payer: Self-pay

## 2017-07-18 ENCOUNTER — Ambulatory Visit: Payer: BLUE CROSS/BLUE SHIELD

## 2017-07-18 ENCOUNTER — Ambulatory Visit (INDEPENDENT_AMBULATORY_CARE_PROVIDER_SITE_OTHER): Payer: BLUE CROSS/BLUE SHIELD | Admitting: Physician Assistant

## 2017-07-18 ENCOUNTER — Encounter: Payer: Self-pay | Admitting: Physician Assistant

## 2017-07-18 VITALS — BP 116/78 | HR 100 | Temp 99.1°F | Ht 65.47 in | Wt 175.4 lb

## 2017-07-18 DIAGNOSIS — Z87891 Personal history of nicotine dependence: Secondary | ICD-10-CM | POA: Diagnosis not present

## 2017-07-18 DIAGNOSIS — Z13220 Encounter for screening for lipoid disorders: Secondary | ICD-10-CM

## 2017-07-18 LAB — LIPID PANEL
CHOLESTEROL TOTAL: 177 mg/dL (ref 100–199)
Chol/HDL Ratio: 3.5 ratio (ref 0.0–4.4)
HDL: 50 mg/dL (ref >39)
LDL Calculated: 106 mg/dL — ABNORMAL HIGH (ref 0–99)
TRIGLYCERIDES: 103 mg/dL (ref 0–149)
VLDL Cholesterol Cal: 21 mg/dL (ref 5–40)

## 2017-07-18 NOTE — Patient Instructions (Addendum)
We should have your labs back within one week and will contact you with these results. Thank you for letting me participate in your health and well being.   Cholesterol Cholesterol is a fat. Your body needs a small amount of cholesterol. Cholesterol (plaque) may build up in your blood vessels (arteries). That makes you more likely to have a heart attack or stroke. You cannot feel your cholesterol level. Having a blood test is the only way to find out if your level is high. Keep your test results. Work with your doctor to keep your cholesterol at a good level. What do the results mean?  Total cholesterol is how much cholesterol is in your blood.  LDL is bad cholesterol. This is the type that can build up. Try to have low LDL.  HDL is good cholesterol. It cleans your blood vessels and carries LDL away. Try to have high HDL.  Triglycerides are fat that the body can store or burn for energy. What are good levels of cholesterol?  Total cholesterol below 200.  LDL below 100 is good for people who have health risks. LDL below 70 is good for people who have very high risks.  HDL above 40 is good. It is best to have HDL of 60 or higher.  Triglycerides below 150. How can I lower my cholesterol? Diet Follow your diet program as told by your doctor.  Choose fish, white meat chicken, or [place] that is roasted or baked. Try not to eat red meat, fried foods, sausage, or lunch meats.  Eat lots of fresh fruits and vegetables.  Choose whole grains, beans, pasta, potatoes, and cereals.  Choose olive oil, corn oil, or canola oil. Only use small amounts.  Try not to eat butter, mayonnaise, shortening, or palm kernel oils.  Try not to eat foods with trans fats.  Choose low-fat or nonfat dairy foods. ? Drink skim or nonfat milk. ? Eat low-fat or nonfat yogurt and cheeses. ? Try not to drink whole milk or cream. ? Try not to eat ice cream, egg yolks, or full-fat cheeses.  Healthy desserts  include angel food cake, ginger snaps, animal crackers, hard candy, popsicles, and low-fat or nonfat frozen yogurt. Try not to eat pastries, cakes, pies, and cookies.  Exercise Follow your exercise program as told by your doctor.  Be more active. Try gardening, walking, and taking the stairs.  Ask your doctor about ways that you can be more active.  Medicine  Take over-the-counter and prescription medicines only as told by your doctor. This information is not intended to replace advice given to you by your health care provider. Make sure you discuss any questions you have with your health care provider. Document Released: 06/18/2008 Document Revised: 10/22/2015 Document Reviewed: 10/02/2015 Elsevier Interactive Patient Education  2018 [employer].  IF you received an x-ray today, you will receive an invoice from [facility]. Please contact [facility] at [phone] with questions or concerns regarding your invoice.   IF you received labwork today, you will receive an invoice from [facility]. Please contact [facility] at [phone] with questions or concerns regarding your invoice.   Our billing staff will not be able to assist you with questions regarding bills from these companies.  You will be contacted with the lab results as soon as they are available. The fastest way to get your results is to activate your My Chart account. Instructions are located on the last page of this paperwork. If you have not heard from  [place] regarding the results in 2 weeks, please contact this office.

## 2017-07-18 NOTE — Progress Notes (Signed)
   Joyclyn Plazola  MRN: [id number] DOB: [birth year]  Subjective:  Tamara Banks is a 42 y.o. female seen in office today for a chief complaint of need of blood work.  Patient had CPE with gynecologist in 01/2018 and wanted to make sure she had all the appropriate lab work. In terms of diet, she is eating 3 well-balanced meals.  Eats mostly  fish, chicken, vegetables, and some grams.  Drinks Pepsi and lots of water.  Not currently engaging in exercise.  Stopped smoking 1 month ago.  She has no other questions or concerns today.  No PMH of diabetes, HTN, HLD, or heart disease. FH of HTN and heart disease in father.  Review of Systems  Per HPI  There are no active problems to display for this patient.   Current Outpatient Medications on File Prior to Visit  Medication Sig Dispense Refill  . benzonatate (TESSALON) 100 MG capsule Take 1-2 capsules (100-200 mg total) by mouth 3 (three) times daily as needed for cough. 40 capsule 0  . cetirizine (ZYRTEC) 10 MG tablet Take 1 tablet (10 mg total) by mouth daily. 90 tablet 0  . cyclobenzaprine (FLEXERIL) 10 MG tablet Take 10 mg by mouth 3 (three) times daily as needed for muscle spasms.    . Dextromethorphan-guaiFENesin (MUCINEX DM MAXIMUM STRENGTH) 60-1200 MG TB12 Take 1 tablet by mouth every 12 (twelve) hours. 20 each 0  . fluticasone (FLONASE) 50 MCG/ACT nasal spray Place 2 sprays into both nostrils daily. 16 g 0  . ibuprofen (ADVIL,MOTRIN) 600 MG tablet Take 1 tablet (600 mg total) by mouth every 6 (six) hours as needed. 30 tablet 0   No current facility-administered medications on file prior to visit.     No Known Allergies   Objective:  BP 116/78 (BP Location: Right Arm, Patient Position: Sitting, Cuff Size: Normal)   Pulse 100   Temp 99.1 F (37.3 C) (Oral)   Ht 5' 5.47" (1.663 m)   Wt 175 lb 6.4 oz (79.6 kg)   LMP 06/28/2017 (Approximate)   BMI 28.77 kg/m   Physical Exam  Constitutional: She is oriented to person, place, and time.  She appears well-developed and well-nourished.  HENT:  Head: Normocephalic and atraumatic.  Eyes: Conjunctivae are normal.  Neck: Normal range of motion.  Pulmonary/Chest: Effort normal.  Neurological: She is alert and oriented to person, place, and time.  Skin: Skin is warm and dry.  Psychiatric: She has a normal mood and affect.  Vitals reviewed.   Assessment and Plan :  1. Former tobacco use 2. Screening for lipid disorders Per chart review, patient is up-to-date on all of her labs except for screen for cholesterol.  Labs pending.  Encouraged beginning structured exercise weekly.  Congratulated on smoking cessation. - Lipid Panel    Tenna Delaine PA-C  Primary Care at [facility] 07/18/2017 8:58 AM

## 2017-07-19 ENCOUNTER — Encounter: Payer: Self-pay | Admitting: Radiology

## 2017-07-21 ENCOUNTER — Telehealth: Payer: Self-pay | Admitting: Physician Assistant

## 2017-07-21 NOTE — Telephone Encounter (Signed)
Pt given results per notes of Tamara Banks, [place] on 07/19/17, patient verbalized understanding. Unable to document in result note due to result note not being routed to [facility].

## 2017-09-06 ENCOUNTER — Telehealth: Payer: Self-pay | Admitting: *Deleted

## 2017-09-06 NOTE — Telephone Encounter (Signed)
Left message to call back  

## 2017-09-06 NOTE — Telephone Encounter (Signed)
Patient is in 04 recall for 07/2017. Please contact patient regarding scheduling follow up breast imaging

## 2017-09-08 NOTE — Telephone Encounter (Signed)
Spoke with patient regarding f/u breast imaging. Pt states she went in April for imaging & due to financial reasons she decided not to have it done. Pt is aware of risk associated with not having imaging done & the importance of having this follow up. Pt has declined to have it done. Routing to provider for review.

## 2017-09-10 NOTE — Telephone Encounter (Signed)
Pt aware of follow up recommendations.  Ok to remove from recall.  Thanks.

## 2017-09-12 NOTE — Telephone Encounter (Signed)
Recall removed -eh

## 2017-10-01 ENCOUNTER — Other Ambulatory Visit: Payer: Self-pay | Admitting: Physician Assistant

## 2017-10-01 DIAGNOSIS — R058 Other specified cough: Secondary | ICD-10-CM

## 2017-10-01 DIAGNOSIS — R05 Cough: Secondary | ICD-10-CM

## 2018-01-26 ENCOUNTER — Ambulatory Visit (INDEPENDENT_AMBULATORY_CARE_PROVIDER_SITE_OTHER): Payer: BLUE CROSS/BLUE SHIELD | Admitting: Certified Nurse Midwife

## 2018-01-26 ENCOUNTER — Encounter: Payer: Self-pay | Admitting: Certified Nurse Midwife

## 2018-01-26 ENCOUNTER — Other Ambulatory Visit: Payer: Self-pay

## 2018-01-26 VITALS — BP 120/80 | HR 64 | Resp 16 | Ht 65.25 in | Wt 182.0 lb

## 2018-01-26 DIAGNOSIS — Z113 Encounter for screening for infections with a predominantly sexual mode of transmission: Secondary | ICD-10-CM

## 2018-01-26 DIAGNOSIS — Z0000 Encounter for general adult medical examination without abnormal findings: Secondary | ICD-10-CM

## 2018-01-26 DIAGNOSIS — R635 Abnormal weight gain: Secondary | ICD-10-CM | POA: Diagnosis not present

## 2018-01-26 DIAGNOSIS — Z659 Problem related to unspecified psychosocial circumstances: Secondary | ICD-10-CM

## 2018-01-26 DIAGNOSIS — Z01419 Encounter for gynecological examination (general) (routine) without abnormal findings: Secondary | ICD-10-CM

## 2018-01-26 DIAGNOSIS — E559 Vitamin D deficiency, unspecified: Secondary | ICD-10-CM

## 2018-01-26 NOTE — Progress Notes (Signed)
42 y.o. [id number] Married  African American Fe here for annual exam. Periods normal no issues. Patient going through divorce with spouse who moved to [place]. Up/down emotions. Has started smoking occasional again for stress relief. 3 children at home are adjusting well. Established care with Dr. Timmothy Euler for sinus issues now.,Requests  labs today. New partner, requests STD screening. Some weight gain, but working on change. No other health issues today. Daughter will be in college next year!  LMP 01/23/18        Sexually active: Yes.    The current method of family planning is tubal ligation.    Exercising: Yes.    walking Smoker:  yes  Review of Systems  Constitutional: Negative.   HENT: Negative.   Eyes: Negative.   Respiratory: Negative.   Cardiovascular: Negative.   Gastrointestinal: Negative.   Genitourinary: Negative.   Musculoskeletal: Negative.   Skin: Negative.   Neurological: Negative.   Endo/Heme/Allergies: Negative.   Psychiatric/Behavioral: Negative.     Health Maintenance: Pap:  01-05-17 neg HPV HR neg History of Abnormal Pap: no MMG:  10/18 bilateral & bilateral u/s, f/u 25mh not done  Self Breast exams: yes Colonoscopy:  none BMD:   none TDaP:  2014 Shingles: no Pneumonia: no Hep C and HIV: both c neg 2018 Labs: if needed   reports that she quit smoking about 7 months ago. Her smoking use included cigarettes. She smoked 0.50 packs per day. She has never used smokeless tobacco. She reports that she drinks about 2.0 standard drinks of alcohol per week. She reports that she does not use drugs.  Past Medical History:  Diagnosis Date  . PID (pelvic inflammatory disease)   . STD (sexually transmitted disease)    chlamydia over 219yrago    Past Surgical History:  Procedure Laterality Date  . APPENDECTOMY    . TUBAL LIGATION    . VULVA /PERINEUM BIOPSY      Current Outpatient Medications  Medication Sig Dispense Refill  . benzonatate (TESSALON) 100 MG  capsule Take 1-2 capsules (100-200 mg total) by mouth 3 (three) times daily as needed for cough. 40 capsule 0  . cetirizine (ZYRTEC) 10 MG tablet Take 1 tablet (10 mg total) by mouth daily. 90 tablet 0  . cyclobenzaprine (FLEXERIL) 10 MG tablet Take 10 mg by mouth 3 (three) times daily as needed for muscle spasms.    . Dextromethorphan-guaiFENesin (MUCINEX DM MAXIMUM STRENGTH) 60-1200 MG TB12 Take 1 tablet by mouth every 12 (twelve) hours. 20 each 0  . fluticasone (FLONASE) 50 MCG/ACT nasal spray Place 2 sprays into both nostrils daily. 16 g 0  . ibuprofen (ADVIL,MOTRIN) 600 MG tablet Take 1 tablet (600 mg total) by mouth every 6 (six) hours as needed. 30 tablet 0   No current facility-administered medications for this visit.     Family History  Problem Relation Age of Onset  . Hypertension Father   . Heart disease Father   . Diabetes Maternal Aunt   . Lung cancer Paternal Grandmother   . Hypertension Paternal Grandfather   . Heart disease Paternal Grandfather        bypass surgery    ROS:  Pertinent items are noted in HPI.  Otherwise, a comprehensive ROS was negative.  Exam:   There were no vitals taken for this visit.   Ht Readings from Last 3 Encounters:  07/18/17 5' 5.47" (1.663 m)  07/04/17 5' 6.54" (1.69 m)  03/09/17 5' 4.75" (1.645 m)    General  appearance: alert, cooperative and appears stated age Head: Normocephalic, without obvious abnormality, atraumatic Neck: no adenopathy, supple, symmetrical, trachea midline and thyroid normal to inspection and palpation Lungs: clear to auscultation bilaterally Breasts: normal appearance, no masses or tenderness, No nipple retraction or dimpling, No nipple discharge or bleeding, No axillary or supraclavicular adenopathy Heart: regular rate and rhythm Abdomen: soft, non-tender; no masses,  no organomegaly Extremities: extremities normal, atraumatic, no cyanosis or edema Skin: Skin color, texture, turgor normal. No rashes or  lesions Lymph nodes: Cervical, supraclavicular, and axillary nodes normal. No abnormal inguinal nodes palpated Neurologic: Grossly normal   Pelvic: External genitalia:  no lesions              Urethra:  normal appearing urethra with no masses, tenderness or lesions              Bartholin's and Skene's: normal                 Vagina: normal appearing vagina with normal color and discharge, no lesions              Cervix: multiparous appearance, no cervical motion tenderness and no lesions              Pap taken: No. Bimanual Exam:  Uterus:  normal size, contour, position, consistency, mobility, non-tender and mid position              Adnexa: normal adnexa and no mass, fullness, tenderness               Rectovaginal: Confirms               Anus:  normal sphincter tone, no lesions  Chaperone present: yes  A:  Well Woman with normal exam  Contraception Tubal ligation  Social stress with divorce and new relationship  Mammogram overdue, did not do 6 month follow up  STD screening  Screening labs        P:   Reviewed health and wellness pertinent to exam  Discussed good communication with family during this time of change is very important. Patient appreciative for recommendation to do this.  Stressed importance of mammogram follow up and SBE monthly, patient will schedule.  Continue follow up with PCP as indicated  Discussed STD protection with condom use.  Labs: GC/Chlamydia, Affirm, STD panel, Hep C  Labs:Lipid panel, CMP, TSH, Vitamin D  Pap smear: no   counseled on breast self exam, mammography screening, STD prevention, HIV risk factors and prevention, feminine hygiene, adequate intake of calcium and vitamin D, diet and exercise for weight management.  return annually or prn  An After Visit Summary was printed and given to the patient.

## 2018-01-27 ENCOUNTER — Other Ambulatory Visit: Payer: Self-pay

## 2018-01-27 ENCOUNTER — Other Ambulatory Visit: Payer: Self-pay | Admitting: Certified Nurse Midwife

## 2018-01-27 DIAGNOSIS — E559 Vitamin D deficiency, unspecified: Secondary | ICD-10-CM

## 2018-01-27 LAB — COMPREHENSIVE METABOLIC PANEL
A/G RATIO: 1.4 (ref 1.2–2.2)
ALK PHOS: 97 IU/L (ref 39–117)
ALT: 23 IU/L (ref 0–32)
AST: 27 IU/L (ref 0–40)
Albumin: 4.2 g/dL (ref 3.5–5.5)
BILIRUBIN TOTAL: 0.3 mg/dL (ref 0.0–1.2)
BUN / CREAT RATIO: 12 (ref 9–23)
BUN: 8 mg/dL (ref 6–24)
CO2: 25 mmol/L (ref 20–29)
Calcium: 9.5 mg/dL (ref 8.7–10.2)
Chloride: 103 mmol/L (ref 96–106)
Creatinine, Ser: 0.66 mg/dL (ref 0.57–1.00)
GFR calc Af Amer: 124 mL/min/{1.73_m2} (ref >59)
GFR calc non Af Amer: 108 mL/min/{1.73_m2} (ref >59)
GLOBULIN, TOTAL: 2.9 g/dL (ref 1.5–4.5)
Glucose: 75 mg/dL (ref 65–99)
POTASSIUM: 4.3 mmol/L (ref 3.5–5.2)
SODIUM: 142 mmol/L (ref 134–144)
Total Protein: 7.1 g/dL (ref 6.0–8.5)

## 2018-01-27 LAB — HEP, RPR, HIV PANEL
HIV Screen 4th Generation wRfx: NONREACTIVE
Hepatitis B Surface Ag: NEGATIVE
RPR Ser Ql: NONREACTIVE

## 2018-01-27 LAB — LIPID PANEL
CHOLESTEROL TOTAL: 174 mg/dL (ref 100–199)
Chol/HDL Ratio: 3 ratio (ref 0.0–4.4)
HDL: 58 mg/dL (ref >39)
LDL CALC: 95 mg/dL (ref 0–99)
TRIGLYCERIDES: 103 mg/dL (ref 0–149)
VLDL CHOLESTEROL CAL: 21 mg/dL (ref 5–40)

## 2018-01-27 LAB — VAGINITIS/VAGINOSIS, DNA PROBE
CANDIDA SPECIES: NEGATIVE
GARDNERELLA VAGINALIS: POSITIVE — AB
TRICHOMONAS VAG: NEGATIVE

## 2018-01-27 LAB — GC/CHLAMYDIA PROBE AMP
Chlamydia trachomatis, NAA: NEGATIVE
Neisseria gonorrhoeae by PCR: NEGATIVE

## 2018-01-27 LAB — HEPATITIS C ANTIBODY

## 2018-01-27 LAB — VITAMIN D 25 HYDROXY (VIT D DEFICIENCY, FRACTURES): VIT D 25 HYDROXY: 8.2 ng/mL — AB (ref 30.0–100.0)

## 2018-01-27 LAB — TSH: TSH: 1.31 u[IU]/mL (ref 0.450–4.500)

## 2018-01-27 MED ORDER — METRONIDAZOLE 500 MG PO TABS: 500.0000 mg | ORAL_TABLET | Freq: Two times a day (BID) | ORAL | 0 refills | Status: AC

## 2018-01-27 MED ORDER — VITAMIN D (ERGOCALCIFEROL) 1.25 MG (50000 UNIT) PO CAPS: 50000.0000 [IU] | ORAL_CAPSULE | ORAL | 0 refills | Status: AC

## 2018-04-17 ENCOUNTER — Other Ambulatory Visit: Payer: Self-pay | Admitting: Certified Nurse Midwife

## 2018-05-01 ENCOUNTER — Other Ambulatory Visit: Payer: BLUE CROSS/BLUE SHIELD

## 2018-05-04 ENCOUNTER — Other Ambulatory Visit: Payer: BLUE CROSS/BLUE SHIELD

## 2018-05-04 ENCOUNTER — Telehealth: Payer: Self-pay | Admitting: Certified Nurse Midwife

## 2018-05-04 NOTE — Telephone Encounter (Signed)
Patient cancelled lab appointment. See staff message

## 2018-12-21 IMAGING — DX DG CHEST 2V
2 series · 2 of 2 positions shown · non-contrast
Comparison: 05/14/2014

CLINICAL DATA: Cough over the last 3 weeks.  Smoking history.

EXAM:
CHEST - 2 VIEW

[chest pa]
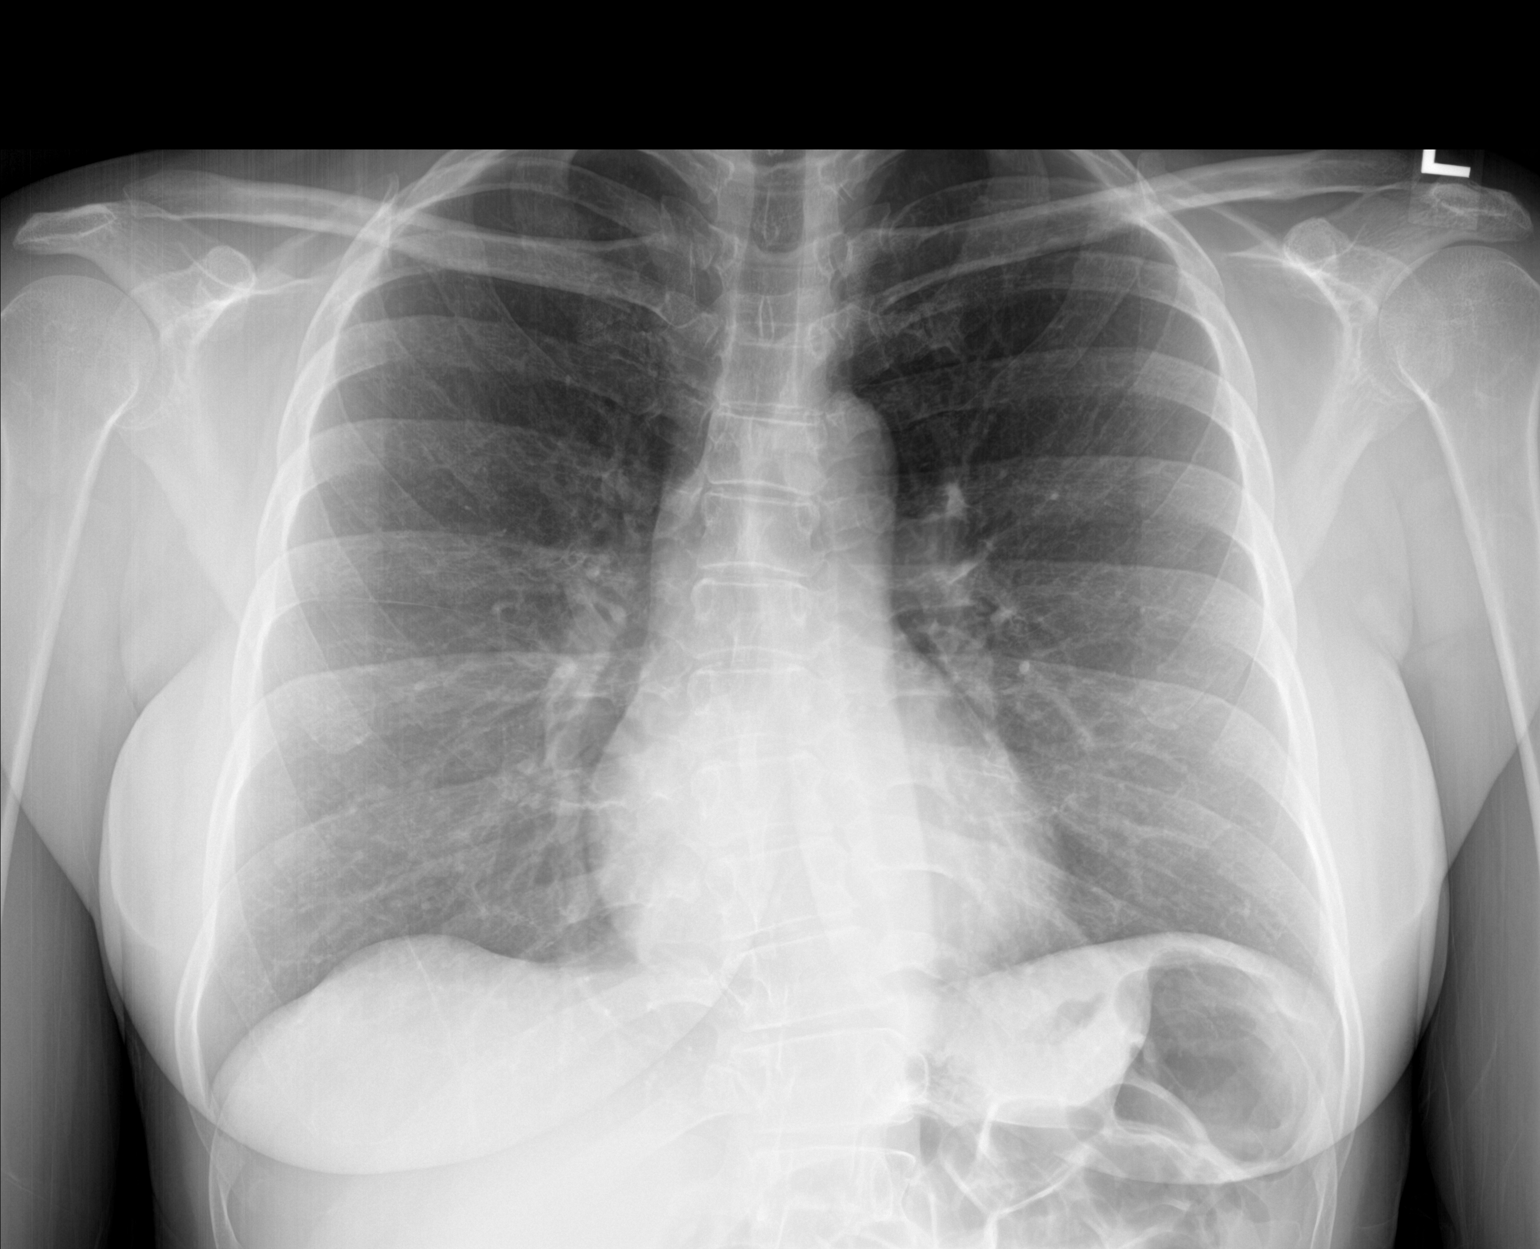

[chest lat]
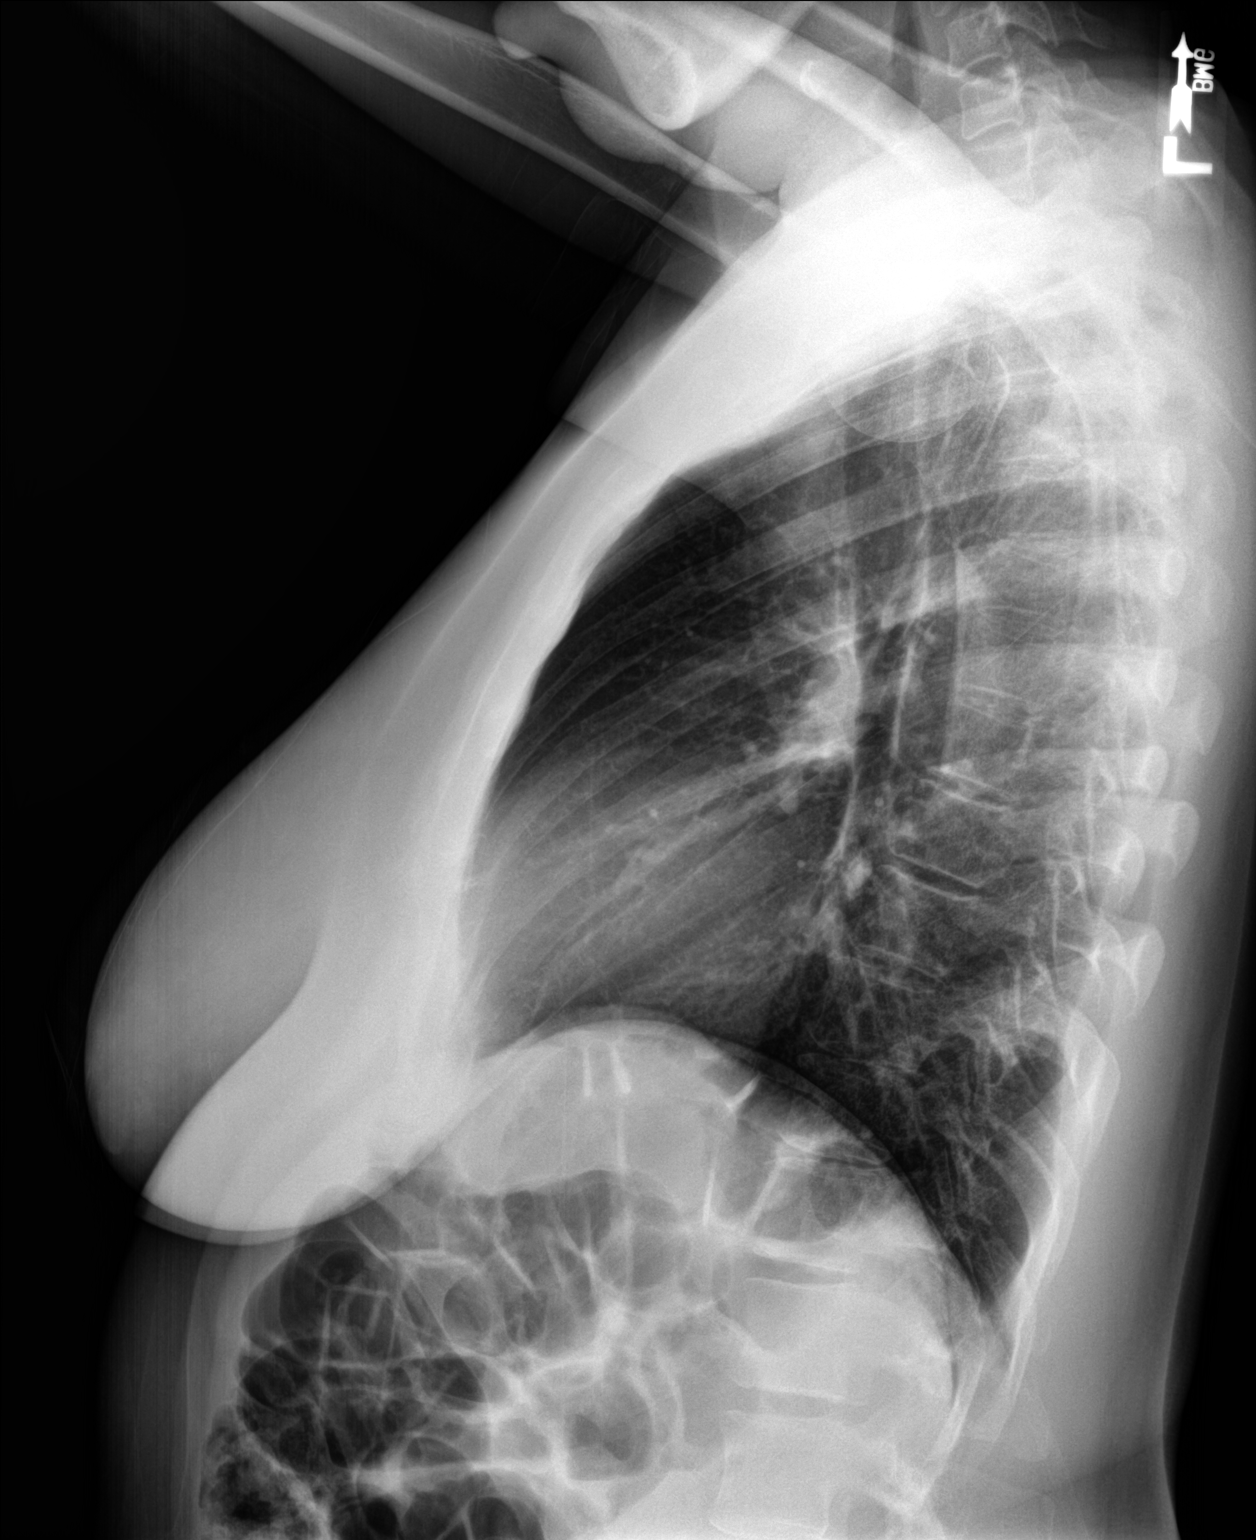

[2 of 2 positions shown; findings below may reference images not displayed]

FINDINGS: Heart size is normal. Mediastinal shadows are normal. The lungs are
clear. The vascularity is normal. No effusions. No bone
abnormalities other than mild spinal curvature.
IMPRESSION: No active cardiopulmonary disease.  Mild spinal curvature.

## 2019-01-31 ENCOUNTER — Encounter: Payer: Self-pay | Admitting: Certified Nurse Midwife

## 2019-01-31 ENCOUNTER — Telehealth: Payer: Self-pay | Admitting: Certified Nurse Midwife

## 2019-01-31 ENCOUNTER — Ambulatory Visit: Payer: BLUE CROSS/BLUE SHIELD | Admitting: Certified Nurse Midwife

## 2019-01-31 NOTE — Progress Notes (Deleted)
43 y.o. [id number] Married  African American Fe here for annual exam.    No LMP recorded.          Sexually active: {yes no:314532}  The current method of family planning is tubal ligation.    Exercising: {yes no:314532}  {types:19826} Smoker:  {YES NO:22349}  ROS  Health Maintenance: Pap:  01-05-17 neg HPV HR neg History of Abnormal Pap: no MMG:  10/18 bilateral & u/s 57mh f/u not done Self Breast exams: {YES NO:22349} Colonoscopy:  none BMD:   none TDaP:  2014 Shingles: no Pneumonia: no Hep C and HIV: both neg 2018 Labs: ***   reports that she has been smoking cigarettes. She has never used smokeless tobacco. She reports current alcohol use of about 2.0 standard drinks of alcohol per week. She reports that she does not use drugs.  Past Medical History:  Diagnosis Date  . PID (pelvic inflammatory disease)   . STD (sexually transmitted disease)    chlamydia over 257yrago    Past Surgical History:  Procedure Laterality Date  . APPENDECTOMY    . TUBAL LIGATION    . VULVA /PERINEUM BIOPSY      Current Outpatient Medications  Medication Sig Dispense Refill  . cetirizine (ZYRTEC) 10 MG tablet Take 1 tablet (10 mg total) by mouth daily. (Patient taking differently: Take 10 mg by mouth as needed. ) 90 tablet 0  . fluticasone (FLONASE) 50 MCG/ACT nasal spray Place 2 sprays into both nostrils daily. 16 g 0  . ibuprofen (ADVIL,MOTRIN) 600 MG tablet Take 1 tablet (600 mg total) by mouth every 6 (six) hours as needed. 30 tablet 0  . metroNIDAZOLE (FLAGYL) 500 MG tablet Take 1 tablet (500 mg total) by mouth 2 (two) times daily. 14 tablet 0  . Vitamin D, Ergocalciferol, (DRISDOL) 50000 units CAPS capsule Take 1 capsule (50,000 Units total) by mouth every 7 (seven) days. 12 capsule 0   No current facility-administered medications for this visit.     Family History  Problem Relation Age of Onset  . Hypertension Father   . Heart disease Father   . Diabetes Maternal Aunt   . Lung  cancer Paternal Grandmother   . Hypertension Paternal Grandfather   . Heart disease Paternal Grandfather        bypass surgery    ROS:  Pertinent items are noted in HPI.  Otherwise, a comprehensive ROS was negative.  Exam:   There were no vitals taken for this visit.   Ht Readings from Last 3 Encounters:  01/26/18 5' 5.25" (1.657 m)  07/18/17 5' 5.47" (1.663 m)  07/04/17 5' 6.54" (1.69 m)    General appearance: alert, cooperative and appears stated age Head: Normocephalic, without obvious abnormality, atraumatic Neck: no adenopathy, supple, symmetrical, trachea midline and thyroid {EXAM; THYROID:18604} Lungs: clear to auscultation bilaterally Breasts: {Exam; breast:13139::"normal appearance, no masses or tenderness"} Heart: regular rate and rhythm Abdomen: soft, non-tender; no masses,  no organomegaly Extremities: extremities normal, atraumatic, no cyanosis or edema Skin: Skin color, texture, turgor normal. No rashes or lesions Lymph nodes: Cervical, supraclavicular, and axillary nodes normal. No abnormal inguinal nodes palpated Neurologic: Grossly normal   Pelvic: External genitalia:  no lesions              Urethra:  normal appearing urethra with no masses, tenderness or lesions              Bartholin's and Skene's: normal  Vagina: normal appearing vagina with normal color and discharge, no lesions              Cervix: {exam; cervix:14595}              Pap taken: {yes no:314532} Bimanual Exam:  Uterus:  {exam; uterus:12215}              Adnexa: {exam; adnexa:12223}               Rectovaginal: Confirms               Anus:  normal sphincter tone, no lesions  Chaperone present: ***  A:  Well Woman with normal exam  P:   Reviewed health and wellness pertinent to exam  Pap smear: {YES NO:22349}  {plan; gyn:5269::"mammogram","pap smear","return annually or prn"}  An After Visit Summary was printed and given to the patient.

## 2019-01-31 NOTE — Telephone Encounter (Signed)
Patient [facility] today's appointment. Letter sent.

## 2019-06-27 ENCOUNTER — Encounter: Payer: Self-pay | Admitting: Certified Nurse Midwife

## 2020-01-21 ENCOUNTER — Encounter: Payer: BLUE CROSS/BLUE SHIELD | Admitting: Family Medicine
